# Patient Record
Sex: Female | Born: 1960
Health system: Southern US, Community
[De-identification: ages and names within clinical notes are randomized; demographics above are authoritative.]

## PROBLEM LIST (undated history)

## (undated) DIAGNOSIS — I10 Essential (primary) hypertension: Secondary | ICD-10-CM

## (undated) DIAGNOSIS — M1990 Unspecified osteoarthritis, unspecified site: Secondary | ICD-10-CM

## (undated) DIAGNOSIS — D049 Carcinoma in situ of skin, unspecified: Secondary | ICD-10-CM

## (undated) DIAGNOSIS — E785 Hyperlipidemia, unspecified: Secondary | ICD-10-CM

## (undated) HISTORY — DX: Hyperlipidemia, unspecified: E78.5

## (undated) HISTORY — DX: Unspecified osteoarthritis, unspecified site: M19.90

## (undated) HISTORY — DX: Carcinoma in situ of skin, unspecified: D04.9

## (undated) HISTORY — PX: CATARACT EXTRACTION: SUR2

## (undated) HISTORY — DX: Essential (primary) hypertension: I10

## (undated) HISTORY — PX: HAND SURGERY: SHX662

---

## 1998-11-25 ENCOUNTER — Other Ambulatory Visit: Admission: RE | Admit: 1998-11-25 | Discharge: 1998-11-25 | Payer: Self-pay | Admitting: Obstetrics and Gynecology

## 1999-01-06 ENCOUNTER — Other Ambulatory Visit: Admission: RE | Admit: 1999-01-06 | Discharge: 1999-01-06 | Payer: Self-pay | Admitting: Obstetrics and Gynecology

## 1999-05-19 ENCOUNTER — Other Ambulatory Visit: Admission: RE | Admit: 1999-05-19 | Discharge: 1999-05-19 | Payer: Self-pay | Admitting: Obstetrics and Gynecology

## 1999-09-02 ENCOUNTER — Other Ambulatory Visit: Admission: RE | Admit: 1999-09-02 | Discharge: 1999-09-02 | Payer: Self-pay | Admitting: Obstetrics and Gynecology

## 1999-11-25 ENCOUNTER — Other Ambulatory Visit: Admission: RE | Admit: 1999-11-25 | Discharge: 1999-11-25 | Payer: Self-pay | Admitting: Obstetrics and Gynecology

## 2000-06-01 ENCOUNTER — Other Ambulatory Visit: Admission: RE | Admit: 2000-06-01 | Discharge: 2000-06-01 | Payer: Self-pay | Admitting: Obstetrics and Gynecology

## 2000-11-30 ENCOUNTER — Other Ambulatory Visit: Admission: RE | Admit: 2000-11-30 | Discharge: 2000-11-30 | Payer: Self-pay | Admitting: Obstetrics and Gynecology

## 2001-06-20 ENCOUNTER — Other Ambulatory Visit: Admission: RE | Admit: 2001-06-20 | Discharge: 2001-06-20 | Payer: Self-pay | Admitting: Obstetrics and Gynecology

## 2001-12-20 ENCOUNTER — Other Ambulatory Visit: Admission: RE | Admit: 2001-12-20 | Discharge: 2001-12-20 | Payer: Self-pay | Admitting: Obstetrics and Gynecology

## 2002-12-25 ENCOUNTER — Encounter: Payer: Self-pay | Admitting: Family Medicine

## 2002-12-25 ENCOUNTER — Encounter: Admission: RE | Admit: 2002-12-25 | Discharge: 2002-12-25 | Payer: Self-pay | Admitting: Family Medicine

## 2004-02-23 ENCOUNTER — Other Ambulatory Visit: Admission: RE | Admit: 2004-02-23 | Discharge: 2004-02-23 | Payer: Self-pay | Admitting: Family Medicine

## 2004-09-28 ENCOUNTER — Encounter: Payer: Self-pay | Admitting: Family Medicine

## 2005-02-08 ENCOUNTER — Encounter: Admission: RE | Admit: 2005-02-08 | Discharge: 2005-02-08 | Payer: Self-pay | Admitting: Family Medicine

## 2005-03-01 ENCOUNTER — Other Ambulatory Visit: Admission: RE | Admit: 2005-03-01 | Discharge: 2005-03-01 | Payer: Self-pay | Admitting: Family Medicine

## 2006-02-15 ENCOUNTER — Encounter: Admission: RE | Admit: 2006-02-15 | Discharge: 2006-02-15 | Payer: Self-pay | Admitting: Family Medicine

## 2006-03-23 ENCOUNTER — Other Ambulatory Visit: Admission: RE | Admit: 2006-03-23 | Discharge: 2006-03-23 | Payer: Self-pay | Admitting: Family Medicine

## 2007-02-21 ENCOUNTER — Encounter: Admission: RE | Admit: 2007-02-21 | Discharge: 2007-02-21 | Payer: Self-pay | Admitting: Family Medicine

## 2007-03-26 ENCOUNTER — Other Ambulatory Visit: Admission: RE | Admit: 2007-03-26 | Discharge: 2007-03-26 | Payer: Self-pay | Admitting: Family Medicine

## 2007-05-23 ENCOUNTER — Ambulatory Visit (HOSPITAL_COMMUNITY): Admission: RE | Admit: 2007-05-23 | Discharge: 2007-05-23 | Payer: Self-pay | Admitting: Family Medicine

## 2008-02-27 ENCOUNTER — Encounter: Admission: RE | Admit: 2008-02-27 | Discharge: 2008-02-27 | Payer: Self-pay | Admitting: Family Medicine

## 2008-04-01 ENCOUNTER — Other Ambulatory Visit: Admission: RE | Admit: 2008-04-01 | Discharge: 2008-04-01 | Payer: Self-pay | Admitting: Family Medicine

## 2008-05-06 ENCOUNTER — Ambulatory Visit (HOSPITAL_COMMUNITY): Admission: RE | Admit: 2008-05-06 | Discharge: 2008-05-06 | Payer: Self-pay | Admitting: Obstetrics and Gynecology

## 2008-07-14 ENCOUNTER — Encounter: Payer: Self-pay | Admitting: Obstetrics and Gynecology

## 2008-07-14 ENCOUNTER — Ambulatory Visit (HOSPITAL_COMMUNITY): Admission: RE | Admit: 2008-07-14 | Discharge: 2008-07-15 | Payer: Self-pay | Admitting: Obstetrics and Gynecology

## 2008-07-14 HISTORY — PX: ROBOTIC ASSISTED TOTAL HYSTERECTOMY: SHX6085

## 2009-03-04 ENCOUNTER — Encounter: Admission: RE | Admit: 2009-03-04 | Discharge: 2009-03-04 | Payer: Self-pay | Admitting: Obstetrics and Gynecology

## 2009-03-09 ENCOUNTER — Encounter: Admission: RE | Admit: 2009-03-09 | Discharge: 2009-03-09 | Payer: Self-pay | Admitting: Obstetrics and Gynecology

## 2009-04-02 ENCOUNTER — Other Ambulatory Visit: Admission: RE | Admit: 2009-04-02 | Discharge: 2009-04-02 | Payer: Self-pay | Admitting: Obstetrics and Gynecology

## 2009-12-01 ENCOUNTER — Other Ambulatory Visit: Admission: RE | Admit: 2009-12-01 | Discharge: 2009-12-01 | Payer: Self-pay | Admitting: Otolaryngology

## 2010-03-10 ENCOUNTER — Encounter: Admission: RE | Admit: 2010-03-10 | Discharge: 2010-03-10 | Payer: Self-pay | Admitting: Obstetrics and Gynecology

## 2010-07-28 ENCOUNTER — Emergency Department (HOSPITAL_COMMUNITY): Admission: EM | Admit: 2010-07-28 | Discharge: 2010-07-28 | Payer: Self-pay | Admitting: Emergency Medicine

## 2010-10-17 ENCOUNTER — Encounter: Payer: Self-pay | Admitting: Obstetrics and Gynecology

## 2011-01-18 ENCOUNTER — Other Ambulatory Visit: Payer: Self-pay | Admitting: Specialist

## 2011-01-18 DIAGNOSIS — M25562 Pain in left knee: Secondary | ICD-10-CM

## 2011-01-20 ENCOUNTER — Other Ambulatory Visit: Payer: Self-pay

## 2011-02-06 ENCOUNTER — Other Ambulatory Visit: Payer: Self-pay | Admitting: Obstetrics and Gynecology

## 2011-02-06 DIAGNOSIS — Z1231 Encounter for screening mammogram for malignant neoplasm of breast: Secondary | ICD-10-CM

## 2011-02-07 NOTE — Op Note (Signed)
NAME:  Wendy Zimmerman, Wendy Zimmerman                    ACCOUNT NO.:  [id number]   MEDICAL RECORD NO.:  [id number]          PATIENT TYPE:  OIB   LOCATION:  1443                         FACILITY:  [facility]   PHYSICIAN:  Cynthia P. Romine, M.D.DATE OF BIRTH:  1961/08/28   DATE OF PROCEDURE:  07/14/2008  DATE OF DISCHARGE:                               OPERATIVE REPORT   PREOPERATIVE DIAGNOSES:  1. Menorrhagia.  2. Large uterine fibroid.   POSTOPERATIVE DIAGNOSES:  1. Menorrhagia.  2. Large uterine fibroid.   PATHOLOGY:  Pending.   PROCEDURE:  Robotic assisted total laparoscopic hysterectomy.   SURGEON:  Dr. Arline Asp Romine   ASSISTANT:  Dr. Genia Del, M.D.   ANESTHESIA:  General endotracheal.   BLOOD LOSS:  200 mL   COMPLICATIONS:  None.   PROCEDURE:  The patient was taken to the operating room and after  induction of adequate general endotracheal anesthesia was positioned in  the dorsal lithotomy position and prepped and draped in the usual  fashion.  A posterior weighted and anterior Sims retractor were placed.  The cervix was grasped on its anterior lip with a single-tooth  tenaculum.  The cervix sounded to 16 cm.  A 10 cm Reamy manipulator was  placed with a medium sized KOH ring and 50 mL of air was used in the  vaginal occluder. Attention was next turned to the abdomen.  A 3 cm  incision was made approximately 6 cm above the umbilicus in the midline  after infiltrating it with half percent plain Marcaine.  The incision  was carried down to the fascia with hemostats.  The fascia was grasped  with Kochers and entered with Mayo scissors.  The fascia was opened with  the scissors. The underlying peritoneum was penetrated bluntly with a  fingertip.  A pursestring suture of 0-Vicryl was placed around the  fascia.  The Hasson manipulator was placed into the peritoneal cavity in  its proper placement and documented with the robotic laparoscope.  The  abdomen was then insufflated with the  automatic insufflator with about  2.5 liters of CO2 on low flow.  The robotic trocars were then marked  staying approximately 9 to 10 cm from the umbilical port and then from  each other.  The two robotic ports on either side of the umbilicus, at  about the same level and the assistance port and the 3rd arm port  slightly lower towards the anterior iliac spine.  The robotic trocars  and the assistance port were inserted under direct visualization.  A PK  gyrus and monopolar scissors were inserted and the pelvis was inspected.  The uterus was quite large with a single fundal myoma.  The tubes and  ovaries, however, appeared normal.  The round ligaments on either side  were coagulated with the PK gyrus and cut with scissors.  The anterior  leaf of the broad ligament was taken down sharply.  The pedicle  containing the utero-ovarian ligaments and the tube on each side was  coagulated and then cut with the monopolar scissors.  The posterior  peritoneum  was taken down sharply.  The bladder was dissected off the  cervix.  The uterine arteries on each side were coagulated multiple  times and then cut.  The monopolar scissors were used to cut around the  opening that had been placed vaginally.  The specimen was passed from  the patient's vaginal cuff.  Because the morcellator is not currently  available due to a manufacturer recall, the specimen was morcellated  vaginally.  The surgeon left the console after removing the robotic  instruments and elevated the patient's legs in the higher dorsolithotomy  position; took her out of her steep Trendelenburg and morcellated the  uterus vaginally.  When it was removed, the surgeon was then able to  close the vaginal cuff using five figure-of-eight sutures of 0-Vicryl  and hemostasis was obtained.  Attention was next turned back to the  abdomen and the laparoscope was reinserted.  The pedicles appeared dry.  The pelvis was irrigated.  There was a small  area of bleeding along the  right pelvic side wall were Surgicel was placed and hemostasis was  achieved.  The uterine artery pedicles and the infundibulopelvic were  dry.  The instruments were then removed under direct visualization.  The  pursestring suture around the fascia above the umbilicus was tied with  good closure of the fascia.  The other incisions were closed  subcuticularly with 4-0 Vicryl and then Dermabond and the procedure was  terminated.  The patient tolerated it well and went in satisfactory  condition to post anesthesia recovery.  The sponge, instrument and  needle counts were correct x3.      Cynthia P. Romine, M.D.  Electronically Signed     CPR/MEDQ  D:  07/14/2008  T:  07/14/2008  Job:  [id number]   cc:   Artist Pais, M.D.  Fax: [phone]   Carola J. Gerri Spore, M.D.  Fax: [phone]

## 2011-03-16 ENCOUNTER — Ambulatory Visit
Admission: RE | Admit: 2011-03-16 | Discharge: 2011-03-16 | Disposition: A | Discharge status: Home / Self Care | Payer: BC Managed Care – PPO | Source: Ambulatory Visit | Attending: Obstetrics and Gynecology | Admitting: Obstetrics and Gynecology

## 2011-03-16 DIAGNOSIS — Z1231 Encounter for screening mammogram for malignant neoplasm of breast: Secondary | ICD-10-CM

## 2011-06-27 LAB — CBC
HCT: 24.8 — ABNORMAL LOW
HCT: 27.6 — ABNORMAL LOW
HCT: 37.4
Hemoglobin: 12.3
Hemoglobin: 8.7 — ABNORMAL LOW
Hemoglobin: 9.3 — ABNORMAL LOW
MCHC: 33
MCHC: 33.8
MCHC: 34.4
MCV: 87.5
MCV: 87.6
MCV: 88.1
Platelets: 207
Platelets: 240
Platelets: 296
RBC: 2.83 — ABNORMAL LOW
RBC: 3.15 — ABNORMAL LOW
RBC: 4.25
RDW: 12.4
RDW: 12.7
RDW: 13
WBC: 10.1
WBC: 5.3
WBC: 8.8

## 2011-06-27 LAB — BASIC METABOLIC PANEL
BUN: 7
CO2: 27
Calcium: 8.8
Chloride: 104
Creatinine, Ser: 0.81
GFR calc Af Amer: >60
GFR calc non Af Amer: >60
Glucose, Bld: 96
Potassium: 3.5
Sodium: 138

## 2011-06-27 LAB — TYPE AND SCREEN
ABO/RH(D): O POS
Antibody Screen: NEGATIVE

## 2011-06-27 LAB — HCG, SERUM, QUALITATIVE: Preg, Serum: NEGATIVE

## 2011-06-27 LAB — ABO/RH: ABO/RH(D): O POS

## 2012-02-05 ENCOUNTER — Other Ambulatory Visit: Payer: Self-pay | Admitting: Family Medicine

## 2012-02-05 DIAGNOSIS — Z1231 Encounter for screening mammogram for malignant neoplasm of breast: Secondary | ICD-10-CM

## 2012-03-29 ENCOUNTER — Ambulatory Visit
Admission: RE | Admit: 2012-03-29 | Discharge: 2012-03-29 | Disposition: A | Discharge status: Home / Self Care | Payer: BC Managed Care – PPO | Source: Ambulatory Visit | Attending: Family Medicine | Admitting: Family Medicine

## 2012-03-29 DIAGNOSIS — Z1231 Encounter for screening mammogram for malignant neoplasm of breast: Secondary | ICD-10-CM

## 2013-02-18 ENCOUNTER — Other Ambulatory Visit: Payer: Self-pay

## 2013-02-18 DIAGNOSIS — Z1231 Encounter for screening mammogram for malignant neoplasm of breast: Secondary | ICD-10-CM

## 2013-04-03 ENCOUNTER — Ambulatory Visit
Admission: RE | Admit: 2013-04-03 | Discharge: 2013-04-03 | Disposition: A | Discharge status: Home / Self Care | Payer: BC Managed Care – PPO | Source: Ambulatory Visit

## 2013-04-03 DIAGNOSIS — Z1231 Encounter for screening mammogram for malignant neoplasm of breast: Secondary | ICD-10-CM

## 2014-02-26 ENCOUNTER — Other Ambulatory Visit: Payer: Self-pay

## 2014-02-26 DIAGNOSIS — Z1231 Encounter for screening mammogram for malignant neoplasm of breast: Secondary | ICD-10-CM

## 2014-03-18 ENCOUNTER — Encounter: Payer: Self-pay | Admitting: *Deleted

## 2014-03-18 ENCOUNTER — Encounter: Payer: Self-pay | Admitting: Interventional Cardiology

## 2014-03-18 DIAGNOSIS — I10 Essential (primary) hypertension: Secondary | ICD-10-CM | POA: Insufficient documentation

## 2014-03-18 DIAGNOSIS — E785 Hyperlipidemia, unspecified: Secondary | ICD-10-CM | POA: Insufficient documentation

## 2014-03-18 DIAGNOSIS — D049 Carcinoma in situ of skin, unspecified: Secondary | ICD-10-CM | POA: Insufficient documentation

## 2014-03-18 DIAGNOSIS — M1990 Unspecified osteoarthritis, unspecified site: Secondary | ICD-10-CM | POA: Insufficient documentation

## 2014-04-09 ENCOUNTER — Ambulatory Visit: Payer: BC Managed Care – PPO

## 2014-04-10 ENCOUNTER — Ambulatory Visit
Admission: RE | Admit: 2014-04-10 | Discharge: 2014-04-10 | Disposition: A | Discharge status: Home / Self Care | Payer: BC Managed Care – PPO | Source: Ambulatory Visit

## 2014-04-10 DIAGNOSIS — Z1231 Encounter for screening mammogram for malignant neoplasm of breast: Secondary | ICD-10-CM

## 2015-03-05 ENCOUNTER — Other Ambulatory Visit: Payer: Self-pay

## 2015-03-05 DIAGNOSIS — Z1231 Encounter for screening mammogram for malignant neoplasm of breast: Secondary | ICD-10-CM

## 2015-04-15 ENCOUNTER — Ambulatory Visit
Admission: RE | Admit: 2015-04-15 | Discharge: 2015-04-15 | Disposition: A | Discharge status: Home / Self Care | Payer: BLUE CROSS/BLUE SHIELD | Source: Ambulatory Visit

## 2015-04-15 DIAGNOSIS — Z1231 Encounter for screening mammogram for malignant neoplasm of breast: Secondary | ICD-10-CM

## 2015-04-20 ENCOUNTER — Other Ambulatory Visit: Payer: Self-pay | Admitting: Family Medicine

## 2015-04-20 DIAGNOSIS — R928 Other abnormal and inconclusive findings on diagnostic imaging of breast: Secondary | ICD-10-CM

## 2015-04-23 ENCOUNTER — Ambulatory Visit
Admission: RE | Admit: 2015-04-23 | Discharge: 2015-04-23 | Disposition: A | Discharge status: Home / Self Care | Payer: BLUE CROSS/BLUE SHIELD | Source: Ambulatory Visit | Attending: Family Medicine | Admitting: Family Medicine

## 2015-04-23 DIAGNOSIS — R928 Other abnormal and inconclusive findings on diagnostic imaging of breast: Secondary | ICD-10-CM

## 2016-03-17 ENCOUNTER — Other Ambulatory Visit: Payer: Self-pay | Admitting: Family Medicine

## 2016-03-17 DIAGNOSIS — Z1231 Encounter for screening mammogram for malignant neoplasm of breast: Secondary | ICD-10-CM

## 2016-04-13 ENCOUNTER — Ambulatory Visit
Admission: RE | Admit: 2016-04-13 | Discharge: 2016-04-13 | Disposition: A | Discharge status: Home / Self Care | Payer: PRIVATE HEALTH INSURANCE | Source: Ambulatory Visit | Attending: Family Medicine | Admitting: Family Medicine

## 2016-04-13 DIAGNOSIS — Z1231 Encounter for screening mammogram for malignant neoplasm of breast: Secondary | ICD-10-CM

## 2017-03-02 ENCOUNTER — Other Ambulatory Visit: Payer: Self-pay | Admitting: Family Medicine

## 2017-03-02 DIAGNOSIS — Z1231 Encounter for screening mammogram for malignant neoplasm of breast: Secondary | ICD-10-CM

## 2017-04-20 ENCOUNTER — Ambulatory Visit
Admission: RE | Admit: 2017-04-20 | Discharge: 2017-04-20 | Disposition: A | Discharge status: Home / Self Care | Payer: PRIVATE HEALTH INSURANCE | Source: Ambulatory Visit | Attending: Family Medicine | Admitting: Family Medicine

## 2017-04-20 DIAGNOSIS — Z1231 Encounter for screening mammogram for malignant neoplasm of breast: Secondary | ICD-10-CM

## 2017-07-13 DIAGNOSIS — H26493 Other secondary cataract, bilateral: Secondary | ICD-10-CM | POA: Diagnosis not present

## 2017-07-20 DIAGNOSIS — Z0000 Encounter for general adult medical examination without abnormal findings: Secondary | ICD-10-CM | POA: Diagnosis not present

## 2017-07-20 DIAGNOSIS — E7800 Pure hypercholesterolemia, unspecified: Secondary | ICD-10-CM | POA: Diagnosis not present

## 2017-07-20 DIAGNOSIS — Z23 Encounter for immunization: Secondary | ICD-10-CM | POA: Diagnosis not present

## 2017-08-03 DIAGNOSIS — H26492 Other secondary cataract, left eye: Secondary | ICD-10-CM | POA: Diagnosis not present

## 2017-08-03 DIAGNOSIS — Z961 Presence of intraocular lens: Secondary | ICD-10-CM | POA: Diagnosis not present

## 2017-08-03 DIAGNOSIS — H26491 Other secondary cataract, right eye: Secondary | ICD-10-CM | POA: Diagnosis not present

## 2017-08-10 DIAGNOSIS — Z961 Presence of intraocular lens: Secondary | ICD-10-CM | POA: Diagnosis not present

## 2017-08-21 DIAGNOSIS — H26492 Other secondary cataract, left eye: Secondary | ICD-10-CM | POA: Diagnosis not present

## 2017-08-31 DIAGNOSIS — Z961 Presence of intraocular lens: Secondary | ICD-10-CM | POA: Diagnosis not present

## 2017-10-19 DIAGNOSIS — R109 Unspecified abdominal pain: Secondary | ICD-10-CM | POA: Diagnosis not present

## 2017-10-22 ENCOUNTER — Other Ambulatory Visit: Payer: Self-pay | Admitting: Physician Assistant

## 2017-10-22 ENCOUNTER — Ambulatory Visit
Admission: RE | Admit: 2017-10-22 | Discharge: 2017-10-22 | Disposition: A | Discharge status: Home / Self Care | Payer: 59 | Source: Ambulatory Visit | Attending: Physician Assistant | Admitting: Physician Assistant

## 2017-10-22 ENCOUNTER — Ambulatory Visit (HOSPITAL_COMMUNITY): Admission: RE | Admit: 2017-10-22 | Payer: 59 | Source: Ambulatory Visit

## 2017-10-22 DIAGNOSIS — R1084 Generalized abdominal pain: Secondary | ICD-10-CM

## 2017-10-22 DIAGNOSIS — R9389 Abnormal findings on diagnostic imaging of other specified body structures: Secondary | ICD-10-CM

## 2017-10-22 DIAGNOSIS — J984 Other disorders of lung: Secondary | ICD-10-CM | POA: Diagnosis not present

## 2017-10-22 DIAGNOSIS — M546 Pain in thoracic spine: Principal | ICD-10-CM

## 2017-10-22 DIAGNOSIS — R109 Unspecified abdominal pain: Secondary | ICD-10-CM | POA: Diagnosis not present

## 2017-10-22 DIAGNOSIS — G8929 Other chronic pain: Secondary | ICD-10-CM

## 2017-10-22 DIAGNOSIS — R918 Other nonspecific abnormal finding of lung field: Secondary | ICD-10-CM | POA: Diagnosis not present

## 2017-10-22 DIAGNOSIS — K449 Diaphragmatic hernia without obstruction or gangrene: Secondary | ICD-10-CM | POA: Diagnosis not present

## 2017-10-22 DIAGNOSIS — R079 Chest pain, unspecified: Secondary | ICD-10-CM | POA: Diagnosis not present

## 2017-10-22 MED ORDER — IOPAMIDOL (ISOVUE-300) INJECTION 61%
125.0000 mL | Freq: Once | INTRAVENOUS | Status: AC | PRN
  Administered 2017-10-22: 125 mL via INTRAVENOUS

## 2017-11-02 DIAGNOSIS — H04123 Dry eye syndrome of bilateral lacrimal glands: Secondary | ICD-10-CM | POA: Diagnosis not present

## 2017-11-05 DIAGNOSIS — N390 Urinary tract infection, site not specified: Secondary | ICD-10-CM | POA: Diagnosis not present

## 2017-11-09 DIAGNOSIS — Z713 Dietary counseling and surveillance: Secondary | ICD-10-CM | POA: Diagnosis not present

## 2018-01-17 DIAGNOSIS — C44619 Basal cell carcinoma of skin of left upper limb, including shoulder: Secondary | ICD-10-CM | POA: Diagnosis not present

## 2018-01-17 DIAGNOSIS — C44519 Basal cell carcinoma of skin of other part of trunk: Secondary | ICD-10-CM | POA: Diagnosis not present

## 2018-01-17 DIAGNOSIS — L820 Inflamed seborrheic keratosis: Secondary | ICD-10-CM | POA: Diagnosis not present

## 2018-02-01 DIAGNOSIS — E7800 Pure hypercholesterolemia, unspecified: Secondary | ICD-10-CM | POA: Diagnosis not present

## 2018-02-22 ENCOUNTER — Other Ambulatory Visit: Payer: Self-pay | Admitting: Family Medicine

## 2018-02-22 DIAGNOSIS — Z1231 Encounter for screening mammogram for malignant neoplasm of breast: Secondary | ICD-10-CM

## 2018-03-01 DIAGNOSIS — Z85828 Personal history of other malignant neoplasm of skin: Secondary | ICD-10-CM | POA: Diagnosis not present

## 2018-03-01 DIAGNOSIS — Z08 Encounter for follow-up examination after completed treatment for malignant neoplasm: Secondary | ICD-10-CM | POA: Diagnosis not present

## 2018-04-12 DIAGNOSIS — C44519 Basal cell carcinoma of skin of other part of trunk: Secondary | ICD-10-CM | POA: Diagnosis not present

## 2018-04-26 ENCOUNTER — Ambulatory Visit
Admission: RE | Admit: 2018-04-26 | Discharge: 2018-04-26 | Disposition: A | Discharge status: Home / Self Care | Payer: 59 | Source: Ambulatory Visit | Attending: Family Medicine | Admitting: Family Medicine

## 2018-04-26 DIAGNOSIS — Z1231 Encounter for screening mammogram for malignant neoplasm of breast: Secondary | ICD-10-CM

## 2018-07-06 DIAGNOSIS — Z23 Encounter for immunization: Secondary | ICD-10-CM | POA: Diagnosis not present

## 2018-09-16 DIAGNOSIS — E7800 Pure hypercholesterolemia, unspecified: Secondary | ICD-10-CM | POA: Diagnosis not present

## 2018-09-16 DIAGNOSIS — I10 Essential (primary) hypertension: Secondary | ICD-10-CM | POA: Diagnosis not present

## 2018-09-16 DIAGNOSIS — Z1159 Encounter for screening for other viral diseases: Secondary | ICD-10-CM | POA: Diagnosis not present

## 2018-09-16 DIAGNOSIS — Z0000 Encounter for general adult medical examination without abnormal findings: Secondary | ICD-10-CM | POA: Diagnosis not present

## 2019-02-24 ENCOUNTER — Other Ambulatory Visit: Payer: Self-pay | Admitting: Physician Assistant

## 2019-02-24 ENCOUNTER — Ambulatory Visit
Admission: RE | Admit: 2019-02-24 | Discharge: 2019-02-24 | Disposition: A | Discharge status: Home / Self Care | Payer: 59 | Source: Ambulatory Visit | Attending: Physician Assistant | Admitting: Physician Assistant

## 2019-02-24 DIAGNOSIS — R110 Nausea: Secondary | ICD-10-CM

## 2019-02-27 ENCOUNTER — Emergency Department (HOSPITAL_COMMUNITY): Payer: 59

## 2019-02-27 ENCOUNTER — Inpatient Hospital Stay (HOSPITAL_COMMUNITY)
Admission: EM | Admit: 2019-02-27 | Discharge: 2019-03-03 | DRG: 419 | Disposition: A | Discharge status: Home / Self Care | Payer: 59 | Attending: Internal Medicine | Admitting: Internal Medicine

## 2019-02-27 ENCOUNTER — Encounter (HOSPITAL_COMMUNITY): Payer: Self-pay

## 2019-02-27 ENCOUNTER — Other Ambulatory Visit: Payer: Self-pay

## 2019-02-27 DIAGNOSIS — Z6837 Body mass index (BMI) 37.0-37.9, adult: Secondary | ICD-10-CM | POA: Diagnosis not present

## 2019-02-27 DIAGNOSIS — K8046 Calculus of bile duct with acute and chronic cholecystitis without obstruction: Principal | ICD-10-CM | POA: Diagnosis present

## 2019-02-27 DIAGNOSIS — E876 Hypokalemia: Secondary | ICD-10-CM | POA: Diagnosis present

## 2019-02-27 DIAGNOSIS — E785 Hyperlipidemia, unspecified: Secondary | ICD-10-CM | POA: Diagnosis present

## 2019-02-27 DIAGNOSIS — R739 Hyperglycemia, unspecified: Secondary | ICD-10-CM | POA: Diagnosis present

## 2019-02-27 DIAGNOSIS — K5750 Diverticulosis of both small and large intestine without perforation or abscess without bleeding: Secondary | ICD-10-CM | POA: Diagnosis present

## 2019-02-27 DIAGNOSIS — K219 Gastro-esophageal reflux disease without esophagitis: Secondary | ICD-10-CM | POA: Diagnosis present

## 2019-02-27 DIAGNOSIS — E7800 Pure hypercholesterolemia, unspecified: Secondary | ICD-10-CM | POA: Diagnosis not present

## 2019-02-27 DIAGNOSIS — K838 Other specified diseases of biliary tract: Secondary | ICD-10-CM | POA: Diagnosis present

## 2019-02-27 DIAGNOSIS — R21 Rash and other nonspecific skin eruption: Secondary | ICD-10-CM | POA: Diagnosis not present

## 2019-02-27 DIAGNOSIS — K7581 Nonalcoholic steatohepatitis (NASH): Secondary | ICD-10-CM | POA: Diagnosis present

## 2019-02-27 DIAGNOSIS — Z888 Allergy status to other drugs, medicaments and biological substances status: Secondary | ICD-10-CM | POA: Diagnosis not present

## 2019-02-27 DIAGNOSIS — Z20828 Contact with and (suspected) exposure to other viral communicable diseases: Secondary | ICD-10-CM | POA: Diagnosis present

## 2019-02-27 DIAGNOSIS — K8050 Calculus of bile duct without cholangitis or cholecystitis without obstruction: Secondary | ICD-10-CM

## 2019-02-27 DIAGNOSIS — F40240 Claustrophobia: Secondary | ICD-10-CM | POA: Diagnosis present

## 2019-02-27 DIAGNOSIS — K429 Umbilical hernia without obstruction or gangrene: Secondary | ICD-10-CM | POA: Diagnosis present

## 2019-02-27 DIAGNOSIS — K8080 Other cholelithiasis without obstruction: Secondary | ICD-10-CM | POA: Diagnosis not present

## 2019-02-27 DIAGNOSIS — Z79899 Other long term (current) drug therapy: Secondary | ICD-10-CM

## 2019-02-27 DIAGNOSIS — X58XXXA Exposure to other specified factors, initial encounter: Secondary | ICD-10-CM | POA: Diagnosis not present

## 2019-02-27 DIAGNOSIS — R7401 Elevation of levels of liver transaminase levels: Secondary | ICD-10-CM | POA: Diagnosis present

## 2019-02-27 DIAGNOSIS — I10 Essential (primary) hypertension: Secondary | ICD-10-CM | POA: Diagnosis present

## 2019-02-27 DIAGNOSIS — K810 Acute cholecystitis: Secondary | ICD-10-CM

## 2019-02-27 DIAGNOSIS — K660 Peritoneal adhesions (postprocedural) (postinfection): Secondary | ICD-10-CM | POA: Diagnosis present

## 2019-02-27 DIAGNOSIS — K828 Other specified diseases of gallbladder: Secondary | ICD-10-CM | POA: Diagnosis present

## 2019-02-27 DIAGNOSIS — T7840XA Allergy, unspecified, initial encounter: Secondary | ICD-10-CM | POA: Diagnosis not present

## 2019-02-27 DIAGNOSIS — R112 Nausea with vomiting, unspecified: Secondary | ICD-10-CM | POA: Diagnosis present

## 2019-02-27 DIAGNOSIS — R740 Nonspecific elevation of levels of transaminase and lactic acid dehydrogenase [LDH]: Secondary | ICD-10-CM | POA: Diagnosis not present

## 2019-02-27 DIAGNOSIS — R1013 Epigastric pain: Secondary | ICD-10-CM | POA: Diagnosis not present

## 2019-02-27 DIAGNOSIS — K819 Cholecystitis, unspecified: Secondary | ICD-10-CM | POA: Diagnosis not present

## 2019-02-27 DIAGNOSIS — Z419 Encounter for procedure for purposes other than remedying health state, unspecified: Secondary | ICD-10-CM

## 2019-02-27 DIAGNOSIS — R1010 Upper abdominal pain, unspecified: Secondary | ICD-10-CM | POA: Diagnosis not present

## 2019-02-27 DIAGNOSIS — R109 Unspecified abdominal pain: Secondary | ICD-10-CM | POA: Insufficient documentation

## 2019-02-27 LAB — COMPREHENSIVE METABOLIC PANEL
ALT: 99 U/L — ABNORMAL HIGH (ref 0–44)
ALT: 174 U/L — ABNORMAL HIGH (ref 0–44)
AST: 131 U/L — ABNORMAL HIGH (ref 15–41)
AST: 236 U/L — ABNORMAL HIGH (ref 15–41)
Albumin: 4.3 g/dL (ref 3.5–5.0)
Albumin: 3.5 g/dL (ref 3.5–5.0)
Alkaline Phosphatase: 263 U/L — ABNORMAL HIGH (ref 38–126)
Alkaline Phosphatase: 250 U/L — ABNORMAL HIGH (ref 38–126)
Anion gap: 12 (ref 5–15)
Anion gap: 7 (ref 5–15)
BUN: 16 mg/dL (ref 6–20)
BUN: 17 mg/dL (ref 6–20)
CO2: 26 mmol/L (ref 22–32)
CO2: 26 mmol/L (ref 22–32)
Calcium: 9.1 mg/dL (ref 8.9–10.3)
Calcium: 8.3 mg/dL — ABNORMAL LOW (ref 8.9–10.3)
Chloride: 101 mmol/L (ref 98–111)
Chloride: 106 mmol/L (ref 98–111)
Creatinine, Ser: 1.11 mg/dL — ABNORMAL HIGH (ref 0.44–1.00)
Creatinine, Ser: 0.97 mg/dL (ref 0.44–1.00)
GFR calc Af Amer: >60 mL/min (ref >60)
GFR calc Af Amer: >60 mL/min (ref >60)
GFR calc non Af Amer: 54 mL/min — ABNORMAL LOW (ref >60)
GFR calc non Af Amer: >60 mL/min (ref >60)
Glucose, Bld: 152 mg/dL — ABNORMAL HIGH (ref 70–99)
Glucose, Bld: 142 mg/dL — ABNORMAL HIGH (ref 70–99)
Potassium: 2.9 mmol/L — ABNORMAL LOW (ref 3.5–5.1)
Potassium: 3.3 mmol/L — ABNORMAL LOW (ref 3.5–5.1)
Sodium: 139 mmol/L (ref 135–145)
Sodium: 139 mmol/L (ref 135–145)
Total Bilirubin: 2.1 mg/dL — ABNORMAL HIGH (ref 0.3–1.2)
Total Bilirubin: 3.9 mg/dL — ABNORMAL HIGH (ref 0.3–1.2)
Total Protein: 8 g/dL (ref 6.5–8.1)
Total Protein: 6.9 g/dL (ref 6.5–8.1)

## 2019-02-27 LAB — CBC WITH DIFFERENTIAL/PLATELET
Abs Immature Granulocytes: 0.04 10*3/uL (ref 0.00–0.07)
Basophils Absolute: 0 10*3/uL (ref 0.0–0.1)
Basophils Relative: 0 %
Eosinophils Absolute: 0 10*3/uL (ref 0.0–0.5)
Eosinophils Relative: 0 %
HCT: 37.1 % (ref 36.0–46.0)
Hemoglobin: 12 g/dL (ref 12.0–15.0)
Immature Granulocytes: 0 %
Lymphocytes Relative: 6 %
Lymphs Abs: 0.7 10*3/uL (ref 0.7–4.0)
MCH: 28.3 pg (ref 26.0–34.0)
MCHC: 32.3 g/dL (ref 30.0–36.0)
MCV: 87.5 fL (ref 80.0–100.0)
Monocytes Absolute: 0.7 10*3/uL (ref 0.1–1.0)
Monocytes Relative: 5 %
Neutro Abs: 11.5 10*3/uL — ABNORMAL HIGH (ref 1.7–7.7)
Neutrophils Relative %: 89 %
Platelets: 250 10*3/uL (ref 150–400)
RBC: 4.24 MIL/uL (ref 3.87–5.11)
RDW: 14.1 % (ref 11.5–15.5)
WBC: 13 10*3/uL — ABNORMAL HIGH (ref 4.0–10.5)
nRBC: 0 % (ref 0.0–0.2)

## 2019-02-27 LAB — URINALYSIS, ROUTINE W REFLEX MICROSCOPIC
Bacteria, UA: NONE SEEN
Glucose, UA: NEGATIVE mg/dL
Hgb urine dipstick: NEGATIVE
Ketones, ur: NEGATIVE mg/dL
Leukocytes,Ua: NEGATIVE
Nitrite: NEGATIVE
Protein, ur: 30 mg/dL — AB
Specific Gravity, Urine: >1.046 — ABNORMAL HIGH (ref 1.005–1.030)
pH: 5 (ref 5.0–8.0)

## 2019-02-27 LAB — CBC
HCT: 42.4 % (ref 36.0–46.0)
Hemoglobin: 13.4 g/dL (ref 12.0–15.0)
MCH: 27.3 pg (ref 26.0–34.0)
MCHC: 31.6 g/dL (ref 30.0–36.0)
MCV: 86.5 fL (ref 80.0–100.0)
Platelets: 296 10*3/uL (ref 150–400)
RBC: 4.9 MIL/uL (ref 3.87–5.11)
RDW: 13.8 % (ref 11.5–15.5)
WBC: 10.4 10*3/uL (ref 4.0–10.5)
nRBC: 0 % (ref 0.0–0.2)

## 2019-02-27 LAB — LIPASE, BLOOD: Lipase: 35 U/L (ref 11–51)

## 2019-02-27 LAB — SARS CORONAVIRUS 2 BY RT PCR (HOSPITAL ORDER, PERFORMED IN ~~LOC~~ HOSPITAL LAB): SARS Coronavirus 2: NEGATIVE

## 2019-02-27 MED ORDER — ACETAMINOPHEN 650 MG RE SUPP: 650.0000 mg | Freq: Four times a day (QID) | RECTAL | Status: DC | PRN

## 2019-02-27 MED ORDER — POTASSIUM CHLORIDE 10 MEQ/100ML IV SOLN
10.0000 meq | INTRAVENOUS | Status: AC
  Administered 2019-02-27 (×3): 10 meq via INTRAVENOUS
  Filled 2019-02-27 (×3): qty 100

## 2019-02-27 MED ORDER — CYCLOSPORINE 0.05 % OP EMUL
1.0000 [drp] | Freq: Two times a day (BID) | OPHTHALMIC | Status: DC
  Administered 2019-02-27: 1 [drp] via OPHTHALMIC
  Filled 2019-02-27 (×2): qty 30

## 2019-02-27 MED ORDER — ACETAMINOPHEN 325 MG PO TABS
650.0000 mg | ORAL_TABLET | Freq: Four times a day (QID) | ORAL | Status: DC | PRN
  Administered 2019-02-28: 650 mg via ORAL
  Filled 2019-02-27: qty 2

## 2019-02-27 MED ORDER — SODIUM CHLORIDE 0.9% FLUSH: 3.0000 mL | Freq: Once | INTRAVENOUS | Status: DC

## 2019-02-27 MED ORDER — SODIUM CHLORIDE 0.9 % IV BOLUS
1000.0000 mL | Freq: Once | INTRAVENOUS | Status: AC
  Administered 2019-02-27: 1000 mL via INTRAVENOUS

## 2019-02-27 MED ORDER — BUPROPION HCL ER (XL) 150 MG PO TB24
150.0000 mg | ORAL_TABLET | ORAL | Status: DC
  Administered 2019-02-28 – 2019-03-02 (×2): 150 mg via ORAL
  Filled 2019-02-27 (×2): qty 1

## 2019-02-27 MED ORDER — HYDROMORPHONE HCL 1 MG/ML IJ SOLN
1.0000 mg | Freq: Once | INTRAMUSCULAR | Status: AC
  Administered 2019-02-27: 1 mg via INTRAVENOUS
  Filled 2019-02-27: qty 1

## 2019-02-27 MED ORDER — ONDANSETRON HCL 4 MG/2ML IJ SOLN: 4.0000 mg | Freq: Four times a day (QID) | INTRAMUSCULAR | Status: DC | PRN

## 2019-02-27 MED ORDER — KCL IN DEXTROSE-NACL 20-5-0.2 MEQ/L-%-% IV SOLN
INTRAVENOUS | Status: DC
  Administered 2019-02-27 – 2019-03-01 (×4): via INTRAVENOUS
  Filled 2019-02-27 (×5): qty 1000

## 2019-02-27 MED ORDER — IOHEXOL 300 MG/ML  SOLN
100.0000 mL | Freq: Once | INTRAMUSCULAR | Status: AC | PRN
  Administered 2019-02-27: 100 mL via INTRAVENOUS

## 2019-02-27 MED ORDER — ONDANSETRON HCL 4 MG/2ML IJ SOLN
4.0000 mg | Freq: Once | INTRAMUSCULAR | Status: AC
  Administered 2019-02-27: 4 mg via INTRAVENOUS
  Filled 2019-02-27: qty 2

## 2019-02-27 MED ORDER — SODIUM CHLORIDE (PF) 0.9 % IJ SOLN
INTRAMUSCULAR | Status: AC
  Filled 2019-02-27: qty 50

## 2019-02-27 MED ORDER — HYDROMORPHONE HCL 1 MG/ML IJ SOLN
1.0000 mg | INTRAMUSCULAR | Status: DC | PRN
  Administered 2019-02-28 (×3): 1 mg via INTRAVENOUS
  Filled 2019-02-27 (×3): qty 1

## 2019-02-27 MED ORDER — ONDANSETRON HCL 4 MG PO TABS: 4.0000 mg | ORAL_TABLET | Freq: Four times a day (QID) | ORAL | Status: DC | PRN

## 2019-02-27 MED ORDER — ONDANSETRON HCL 4 MG/2ML IJ SOLN
4.0000 mg | Freq: Once | INTRAMUSCULAR | Status: AC | PRN
  Administered 2019-02-27: 4 mg via INTRAVENOUS
  Filled 2019-02-27: qty 2

## 2019-02-27 MED ORDER — ONDANSETRON HCL 4 MG/2ML IJ SOLN
4.0000 mg | Freq: Four times a day (QID) | INTRAMUSCULAR | Status: DC | PRN
  Administered 2019-02-28: 4 mg via INTRAVENOUS
  Filled 2019-02-27: qty 2

## 2019-02-27 MED ORDER — PANTOPRAZOLE SODIUM 40 MG PO TBEC
40.0000 mg | DELAYED_RELEASE_TABLET | Freq: Every day | ORAL | Status: DC
  Administered 2019-02-28 – 2019-03-02 (×3): 40 mg via ORAL
  Filled 2019-02-27 (×3): qty 1

## 2019-02-27 NOTE — ED Notes (Signed)
MADE URINE REQUEST PATIENT UNABLE TO GO AT THIS TIME

## 2019-02-27 NOTE — Consult Note (Signed)
Reason for Consult: Probable CBD stones Referring Physician: Hospital team  Wendy Zimmerman is an 58 y.o. female.  HPI: Patient has had about 7 acute onset of midepigastric pain which can last anywhere from 30 minutes to a few hours associated with some nausea and she initially saw her primary doctor who did an ultrasound and in between the spells she is fine without any GI symptoms and is never been told she had elevated liver tests and her hospital computer chart in our office computer chart was reviewed and this morning she was woken up with the worst pain yet presented to the emergency room and CT scan showed her CBD being significantly more dilated than on recent ultrasound and her liver tests were increased and we were consulted for further work-up and plans she is currently refusing an MRCP since she was unable to get in the open MRI scanner a few years back and currently she is pain-free but does have a little nausea and is feeling better and she has not had an upper tract work-up in the past but has had a screening colonoscopy  Past Medical History:  Diagnosis Date  . Bowen disease   . HTN (hypertension)   . Hyperlipidemia   . Osteoarthritis     Past Surgical History:  Procedure Laterality Date  . CATARACT EXTRACTION    . HAND SURGERY      Family History  Problem Relation Age of Onset  . Cancer Other   . Hypertension Other   . CAD Other     Social History:  reports that she has never smoked. She has never used smokeless tobacco. She reports that she does not drink alcohol or use drugs.  Allergies:  Allergies  Allergen Reactions  . Lisinopril Swelling and Other (See Comments)    Face and tongue swelling    Medications: I have reviewed the patient's current medications.  Results for orders placed or performed during the hospital encounter of 02/27/19 (from the past 48 hour(s))  Lipase, blood     Status: None   Collection Time: 02/27/19  6:12 AM  Result Value Ref Range    Lipase 35 11 - 51 U/L    Comment: Performed at [facility], [place] [address], [place], [place] [address]  Comprehensive metabolic panel     Status: Abnormal   Collection Time: 02/27/19  6:12 AM  Result Value Ref Range   Sodium 139 135 - 145 mmol/L   Potassium 2.9 (L) 3.5 - 5.1 mmol/L   Chloride 101 98 - 111 mmol/L   CO2 26 22 - 32 mmol/L   Glucose, Bld 152 (H) 70 - 99 mg/dL   BUN 16 6 - 20 mg/dL   Creatinine, Ser 1.11 (H) 0.44 - 1.00 mg/dL   Calcium 9.1 8.9 - 10.3 mg/dL   Total Protein 8.0 6.5 - 8.1 g/dL   Albumin 4.3 3.5 - 5.0 g/dL   AST 131 (H) 15 - 41 U/L   ALT 99 (H) 0 - 44 U/L   Alkaline Phosphatase 263 (H) 38 - 126 U/L   Total Bilirubin 2.1 (H) 0.3 - 1.2 mg/dL   GFR calc non Af Amer 54 (L) >60 mL/min   GFR calc Af Amer >60 >60 mL/min   Anion gap 12 5 - 15    Comment: Performed at [facility], [place] [address], [place], [place] [address]  CBC     Status: None   Collection Time: 02/27/19  6:12 AM  Result Value Ref Range   WBC 10.4 4.0 - 10.5 K/uL   RBC 4.90 3.87 - 5.11 MIL/uL   Hemoglobin 13.4 12.0 - 15.0 g/dL   HCT 42.4 36.0 - 46.0 %   MCV 86.5 80.0 - 100.0 fL   MCH 27.3 26.0 - 34.0 pg   MCHC 31.6 30.0 - 36.0 g/dL   RDW 13.8 11.5 - 15.5 %   Platelets 296 150 - 400 K/uL   nRBC 0.0 0.0 - 0.2 %    Comment: Performed at [facility], [place] [address] [place], [place] [address]  SARS Coronavirus 2 (CEPHEID - Performed in [facility] hospital lab), Hosp Order     Status: None   Collection Time: 02/27/19 10:53 AM  Result Value Ref Range   SARS Coronavirus 2 NEGATIVE NEGATIVE    Comment: (NOTE) If result is NEGATIVE SARS-CoV-2 target nucleic acids are NOT DETECTED. The SARS-CoV-2 RNA is generally detectable in upper and lower  respiratory specimens during the acute phase of infection. The lowest  concentration of SARS-CoV-2 viral copies this assay can detect is 250  copies / mL. A negative result does not preclude  SARS-CoV-2 infection  and should not be used as the sole basis for treatment or other  patient management decisions.  A negative result may occur with  improper specimen collection / handling, submission of specimen other  than nasopharyngeal swab, presence of viral mutation(s) within the  areas targeted by this assay, and inadequate number of viral copies  (<250 copies / mL). A negative result must be combined with clinical  observations, patient history, and epidemiological information. If result is POSITIVE SARS-CoV-2 target nucleic acids are DETECTED. The SARS-CoV-2 RNA is generally detectable in upper and lower  respiratory specimens dur ing the acute phase of infection.  Positive  results are indicative of active infection with SARS-CoV-2.  Clinical  correlation with patient history and other diagnostic information is  necessary to determine patient infection status.  Positive results do  not rule out bacterial infection or co-infection with other viruses. If result is PRESUMPTIVE POSTIVE SARS-CoV-2 nucleic acids MAY BE PRESENT.   A presumptive positive result was obtained on the submitted specimen  and confirmed on repeat testing.  While 2019 novel coronavirus  (SARS-CoV-2) nucleic acids may be present in the submitted sample  additional confirmatory testing may be necessary for epidemiological  and / or clinical management purposes  to differentiate between  SARS-CoV-2 and other Sarbecovirus currently known to infect humans.  If clinically indicated additional testing with an alternate test  methodology [phone]) is advised. The SARS-CoV-2 RNA is generally  detectable in upper and lower respiratory sp ecimens during the acute  phase of infection. The expected result is Negative. Fact Sheet for Patients:  StrictlyIdeas.no Fact Sheet for Healthcare Providers: BankingDealers.co.za This test is not yet approved or cleared by the  [place] FDA and has been authorized for detection and/or diagnosis of SARS-CoV-2 by FDA under an Emergency Use Authorization (EUA).  This EUA will remain in effect (meaning this test can be used) for the duration of the COVID-19 declaration under Section 564(b)(1) of the Act, 21 U.S.C. section 360bbb-3(b)(1), unless the authorization is terminated or revoked sooner. Performed at [facility], [place] [address], [place], [place] [address]     Ct Abdomen Pelvis W Contrast  Result Date: 02/27/2019 CLINICAL DATA:  Severe abdominal pain since last night. Nausea and diarrhea EXAM: CT ABDOMEN AND PELVIS WITH CONTRAST TECHNIQUE: Multidetector CT  imaging of the abdomen and pelvis was performed using the standard protocol following bolus administration of intravenous contrast. CONTRAST:  155mL OMNIPAQUE IOHEXOL 300 MG/ML  SOLN COMPARISON:  10/22/2017 FINDINGS: Lower chest: Fluid seen in the lower esophagus, presumed reflux. Mild atelectasis or scarring at the lung bases. Hepatobiliary: No focal liver abnormality.Prominent diameter of intrahepatic bile ducts. Common bile duct is also newly enlarged at 13 mm diameter on coronal reformats. No calcified choledocholithiasis or acute cholecystitis. Pancreas: Unremarkable. Spleen: Unremarkable. Adrenals/Urinary Tract: Negative adrenals. No hydronephrosis or stone. Small left renal cystic density. Unremarkable bladder. Stomach/Bowel: No obstruction. No appendicitis. Sigmoid diverticulosis. Vascular/Lymphatic: No acute vascular abnormality. No mass or adenopathy. Reproductive:Hysterectomy. Other: No ascites or pneumoperitoneum.  Fatty right inguinal hernia. Musculoskeletal: No acute abnormalities. Remote fractures of the right hemipelvis with right sacroiliac ankylosis. IMPRESSION: 1. Mild intra and extrahepatic bile duct dilatation that is new from 2019, recommend sonographic correlation given LFTs. 2. Sigmoid diverticulosis. 3. Fatty right inguinal  hernia. Electronically Signed   By: Monte Fantasia M.D.   On: 02/27/2019 08:19    ROS negative except above Blood pressure 111/75, pulse 81, temperature 98.7 F (37.1 C), temperature source Oral, resp. rate 14, SpO2 96 %. Physical Exam vital signs stable afebrile no acute distress lungs are clear heart regular rate and rhythm abdomen is soft nontender occasional bowel sounds labs and CT reviewed as above  Assessment/Plan: Probable CBD stones Plan: We discussed MRCP versus endoscopic ultrasound versus ERCP and the pluses and minuses of all and after our conversation we elected to proceed with an ERCP tomorrow and the success rate and risks were discussed with further work-up and plans pending those findings  [place] E 02/27/2019, 3:09 PM

## 2019-02-27 NOTE — ED Notes (Signed)
Bed: WA10 Expected date:  Expected time:  Means of arrival:  Comments: 

## 2019-02-27 NOTE — ED Provider Notes (Addendum)
  Physical Exam  BP 114/75   Pulse 94   Temp 98.7 F (37.1 C) (Oral)   Resp 14   SpO2 91%   Physical Exam  ED Course/Procedures     Procedures  MDM  Patient presented with abdominal pain.  Has been recurrent over the last week.  Epigastric.  Had ultrasound 4 days ago that was reassuring, however now had CT scan done and has new elevated LFTs along with intra-and extrahepatic biliary dilatation.  No clear cause of the seen.  Patient's pain had improved but during oral trial of just water patient became nauseous diaphoretic and was unable to eat more.  With this inability to tolerate oral will admit to hospitalist along with consult [facility].  Of note patient does state that she does not do well in close spaces and cannot do a closed MRI.       Davonna Belling, MD 02/27/19 1037  Discussed with Dr. Watt Climes.  He will see patient in consult.  No further lab work needed at this time.    Davonna Belling, MD 02/27/19 1104

## 2019-02-27 NOTE — ED Notes (Signed)
Called [facility] to inquire delay in pt being moved to floor. Informed they were waiting for a medical bed to become available.

## 2019-02-27 NOTE — ED Provider Notes (Signed)
[facility] DEPT Provider Note  CSN: [id number] Arrival date & time: 02/27/19 2376  Chief Complaint(s) Abdominal Pain  HPI Wendy Zimmerman is a 58 y.o. female who presents to the emergency department with several weeks of intermittent epigastric abdominal pain.  Over the last several days pain has become more frequent.  She spoke with her primary care doctor who ordered an ultrasound on June 1 of the right upper quadrant which was unremarkable.  She reports that her pain has now become constant since 11 PM last night.  She reports that the pain is a cramping and stabbing pain in the epigastrium that radiates around to her back and down to her umbilicus.  Pain is severe.  No alleviating or aggravating factors.  Patient has tried taking Tylenol and Mylanta without relief.  She endorses nausea with nonbloody nonbilious emesis.  She reported one episode of diarrhea.  No known sick contacts.  No suspicious food intake.  She reports that she has been on a bland diet for the past week.  She denies any urinary symptoms.  No chest pain or shortness of breath.  No fevers or chills.  No coughing or congestion.  HPI  Past Medical History Past Medical History:  Diagnosis Date  . Bowen disease   . HTN (hypertension)   . Hyperlipidemia   . Osteoarthritis    Patient Active Problem List   Diagnosis Date Noted  . HTN (hypertension)   . Osteoarthritis   . Hyperlipidemia   . Bowen disease    Home Medication(s) Prior to Admission medications   Medication Sig Start Date End Date Taking? Authorizing Provider  Flaxseed, Linseed, (FLAX SEED OIL PO) Take 1 tablet by mouth daily.    [provider]  MAGNESIUM PO Take 1 tablet by mouth daily.    [provider]  Multiple Vitamin (MULTIVITAMIN) capsule Take 1 capsule by mouth daily.    [provider]  olmesartan-hydrochlorothiazide (BENICAR HCT) 20-12.5 MG per tablet Take 1 tablet by mouth daily.    [provider]                                                                                                                                    Past Surgical History Past Surgical History:  Procedure Laterality Date  . CATARACT EXTRACTION    . HAND SURGERY     Family History Family History  Problem Relation Age of Onset  . Cancer Other   . Hypertension Other   . CAD Other     Social History Social History   Tobacco Use  . Smoking status: Never Smoker  . Smokeless tobacco: Never Used  Substance Use Topics  . Alcohol use: Never    Frequency: Never  . Drug use: Never   Allergies Patient has no known allergies.  Review of Systems Review of Systems All other systems are reviewed and are negative for  acute change except as noted in the HPI  Physical Exam Vital Signs  I have reviewed the triage vital signs BP (!) 143/77 (BP Location: Left Arm)   Pulse (!) 113   Temp 98.7 F (37.1 C) (Oral)   Resp 20   SpO2 99%   Physical Exam Vitals signs reviewed.  Constitutional:      General: She is not in acute distress.    Appearance: She is well-developed. She is not diaphoretic.  HENT:     Head: Normocephalic and atraumatic.     Right Ear: External ear normal.     Left Ear: External ear normal.     Nose: Nose normal.  Eyes:     General: No scleral icterus.    Conjunctiva/sclera: Conjunctivae normal.  Neck:     Musculoskeletal: Normal range of motion.     Trachea: Phonation normal.  Cardiovascular:     Rate and Rhythm: Normal rate and regular rhythm.  Pulmonary:     Effort: Pulmonary effort is normal. No respiratory distress.     Breath sounds: No stridor.  Abdominal:     General: There is no distension.     Tenderness: There is abdominal tenderness in the right upper quadrant, epigastric area and left upper quadrant. There is guarding. There is no rebound. Negative signs include Murphy's sign.     Hernia: No hernia is present.  Musculoskeletal: Normal range of  motion.  Neurological:     Mental Status: She is alert and oriented to person, place, and time.  Psychiatric:        Behavior: Behavior normal.     ED Results and Treatments Labs (all labs ordered are listed, but only abnormal results are displayed) Labs Reviewed  LIPASE, BLOOD  COMPREHENSIVE METABOLIC PANEL  CBC  URINALYSIS, ROUTINE W REFLEX MICROSCOPIC                                                                                                                         EKG  EKG Interpretation  Date/Time:  Thursday February 27 2019 05:30:05 EDT Ventricular Rate:  108 PR Interval:    QRS Duration: 92 QT Interval:  345 QTC Calculation: 463 R Axis:   24 Text Interpretation:  Sinus tachycardia Probable left atrial enlargement Borderline repolarization abnormality Baseline wander in lead(s) II III aVF Confirmed by Addison Lank [phone]) on 02/27/2019 5:46:42 AM      Radiology No results found. Pertinent labs & imaging results that were available during my care of the patient were reviewed by me and considered in my medical decision making (see chart for details).  Medications Ordered in ED Medications  sodium chloride flush (NS) 0.9 % injection 3 mL (has no administration in time range)  ondansetron (ZOFRAN) injection 4 mg (has no administration in time range)  Procedures Procedures  (including critical care time)  Medical Decision Making / ED Course I have reviewed the nursing notes for this encounter and the patient's prior records (if available in EHR or on provided paperwork).    Severe abd pain with epigastric abd pain. Normal RUQ 3 days ago. Labs w/o leukocytosis. Labs with mild hypokalemia and evidence biliary obstruction. Lipase negative. No leukocytosis.  Provided with nausea and pain meds as well as IVF.  Will get CT abd to assess for  etiology given normal [place].  Patient care turned over to Dr Alvino Chapel at 0700. Patient case and results discussed in detail; please see their note for further ED managment.      This chart was dictated using voice recognition software.  Despite best efforts to proofread,  errors can occur which can change the documentation meaning.   Fatima Blank, MD 02/27/19 [phone]

## 2019-02-27 NOTE — ED Triage Notes (Signed)
Pt complains of severe abdominal pain since 11pm last night, she states she's very nauseated and had diarrhea at lunch Pt had the same episode twice last week Had a gallbladder ultrasound that was normal

## 2019-02-27 NOTE — H&P (Signed)
History and Physical        Hospital Admission Note Date: 02/27/2019  Patient name: Wendy Zimmerman Medical record number: [id number] Date of birth: [birth year] Age: 58 y.o. Gender: female  PCP: Kelton Pillar, MD    Patient coming from: Home  I have reviewed all records in the [facility].    Chief Complaint:  Intermittent episodes of severe abdominal pain in the last 1 week  HPI: Patient is a 58 year old female with history of hypertension, hyperlipidemia, Bowen's disease presented to ED with intermittent episodes of severe abdominal pain in the last 1 week.  Patient reported that the first episode of abdominal pain happened on the Memorial Day weekend, lasted less than an hour.  She had nausea and vomiting.  Subsequently she had 2 more episodes, in between the episodes, patient does not have any abdominal pain, nausea or vomiting.  Her appetite has been low in the last 1 week.  Last night she started having severe abdominal pain, described as epigastric, periumbilical and right upper quadrant, 10/10, sharp with nausea but no vomiting.  No recent loss of weight.  No fevers or chills.  No shortness of breath.  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  Patient called her PCPs office and had abdominal ultrasound done on 6/1, which was essentially normal, no gallstones or wall thickening   ED work-up showed transaminitis with elevated alk phos 263, total bilirubin 2.1, elevated AST, ALT Potassium 2.9. CT abdomen showed new mild intra-and extrahepatic biliary duct dilatation  Patient could not tolerate trial of oral liquids in ED due to significant nausea, abdominal pain.  Per patient, abdominal pain improved after IV pain medication.  Hospitalist service was requested for admission and further work-up.  ED work-up/course:  Temp 98.7, pulse 113, BP 143/77, subsequently  104/74.  Heart rate improved to 89.  Review of Systems: Positives marked in 'bold' Constitutional: Denies fever, chills, diaphoresis, poor appetite and fatigue.  HEENT: Denies photophobia, eye pain, redness, hearing loss, ear pain, congestion, sore throat, rhinorrhea, sneezing, mouth sores, trouble swallowing, neck pain, neck stiffness and tinnitus.   Respiratory: Denies SOB, DOE, cough, chest tightness,  and wheezing.   Cardiovascular: Denies chest pain, palpitations and leg swelling.  Gastrointestinal: See HPI Genitourinary: Denies dysuria, urgency, frequency, hematuria, flank pain and difficulty urinating.  Musculoskeletal: Denies myalgias, back pain, joint swelling, arthralgias and gait problem.  Skin: Denies pallor, rash and wound.  Neurological: Denies dizziness, seizures, syncope, weakness, light-headedness, numbness and headaches.  Hematological: Denies adenopathy. Easy bruising, personal or family bleeding history  Psychiatric/Behavioral: Denies suicidal ideation, mood changes, confusion, nervousness, sleep disturbance and agitation  Past Medical History: Past Medical History:  Diagnosis Date  . Bowen disease   . HTN (hypertension)   . Hyperlipidemia   . Osteoarthritis     Past Surgical History:  Procedure Laterality Date  . CATARACT EXTRACTION    . HAND SURGERY      Medications: Prior to Admission medications   Medication Sig Start Date End Date Taking? Authorizing Provider  acetaminophen (TYLENOL) 500 MG tablet Take 1,000 mg by mouth every 6 (six) hours as needed (For pain.).   Yes [provider]  aspirin EC 81 MG tablet Take 81 mg by mouth daily.   Yes [provider]  atorvastatin (LIPITOR) 20 MG tablet Take 20 mg by mouth daily.   Yes [provider]  buPROPion (WELLBUTRIN XL) 150 MG 24 hr tablet Take 150 mg by mouth every other day.   Yes [provider]  cycloSPORINE (RESTASIS) 0.05 % ophthalmic emulsion Place 1 drop into both  eyes 2 (two) times daily.   Yes [provider]  esomeprazole (NEXIUM) 20 MG capsule Take 20 mg by mouth 2 (two) times a day.   Yes [provider]  Multiple Vitamin (MULTIVITAMIN WITH MINERALS) TABS tablet Take 1 tablet by mouth daily.   Yes [provider]  valsartan-hydrochlorothiazide (DIOVAN-HCT) 320-12.5 MG tablet Take 1 tablet by mouth daily.   Yes [provider]    Allergies:   Allergies  Allergen Reactions  . Lisinopril Swelling and Other (See Comments)    Face and tongue swelling    Social History:  reports that she has never smoked. She has never used smokeless tobacco. She reports that she does not drink alcohol or use drugs.  Family History: Family History  Problem Relation Age of Onset  .  Breast cancer   mother   . Hypertension Other   . CAD Other     Physical Exam: Blood pressure 140/83, pulse 92, temperature 98.7 F (37.1 C), temperature source Oral, resp. rate (!) 21, SpO2 97 %. General: Alert, awake, oriented x3, in no acute distress. Eyes: pink conjunctiva,anicteric sclera, pupils equal and reactive to light and accomodation, HEENT: normocephalic, atraumatic, oropharynx clear Neck: supple, no masses or lymphadenopathy, no goiter, no bruits, no JVD CVS: Regular rate and rhythm, without murmurs, rubs or gallops. No lower extremity edema Resp : Clear to auscultation bilaterally, no wheezing, rales or rhonchi. GI : Soft, nontender, nondistended, positive bowel sounds, No hernia.  At the time of my examination, no abdominal tenderness.  Musculoskeletal: No clubbing or cyanosis, positive pedal pulses. No contracture. ROM intact  Neuro: Grossly intact, no focal neurological deficits, strength 5/5 upper and lower extremities bilaterally Psych: alert and oriented x 3, normal mood and affect Skin: no rashes or lesions, warm and dry   LABS on Admission: I have personally reviewed all the labs and imagings below    Basic  Metabolic Panel: Recent Labs  Lab 02/27/19 0612  NA 139  K 2.9*  CL 101  CO2 26  GLUCOSE 152*  BUN 16  CREATININE 1.11*  CALCIUM 9.1   Liver Function Tests: Recent Labs  Lab 02/27/19 0612  AST 131*  ALT 99*  ALKPHOS 263*  BILITOT 2.1*  PROT 8.0  ALBUMIN 4.3   Recent Labs  Lab 02/27/19 0612  LIPASE 35   No results for input(s): AMMONIA in the last 168 hours. CBC: Recent Labs  Lab 02/27/19 0612  WBC 10.4  HGB 13.4  HCT 42.4  MCV 86.5  PLT 296   Cardiac Enzymes: No results for input(s): CKTOTAL, CKMB, CKMBINDEX, TROPONINI in the last 168 hours. BNP: Invalid input(s): POCBNP CBG: No results for input(s): GLUCAP in the last 168 hours.  Radiological Exams on Admission:  Ct Abdomen Pelvis W Contrast  Result Date: 02/27/2019 CLINICAL DATA:  Severe abdominal pain since last night. Nausea and diarrhea EXAM: CT ABDOMEN AND PELVIS WITH CONTRAST TECHNIQUE: Multidetector CT imaging of the abdomen and pelvis was performed using the standard protocol following bolus administration of intravenous contrast. CONTRAST:  191m OMNIPAQUE  IOHEXOL 300 MG/ML  SOLN COMPARISON:  10/22/2017 FINDINGS: Lower chest: Fluid seen in the lower esophagus, presumed reflux. Mild atelectasis or scarring at the lung bases. Hepatobiliary: No focal liver abnormality.Prominent diameter of intrahepatic bile ducts. Common bile duct is also newly enlarged at 13 mm diameter on coronal reformats. No calcified choledocholithiasis or acute cholecystitis. Pancreas: Unremarkable. Spleen: Unremarkable. Adrenals/Urinary Tract: Negative adrenals. No hydronephrosis or stone. Small left renal cystic density. Unremarkable bladder. Stomach/Bowel: No obstruction. No appendicitis. Sigmoid diverticulosis. Vascular/Lymphatic: No acute vascular abnormality. No mass or adenopathy. Reproductive:Hysterectomy. Other: No ascites or pneumoperitoneum.  Fatty right inguinal hernia. Musculoskeletal: No acute abnormalities. Remote fractures  of the right hemipelvis with right sacroiliac ankylosis. IMPRESSION: 1. Mild intra and extrahepatic bile duct dilatation that is new from 2019, recommend sonographic correlation given LFTs. 2. Sigmoid diverticulosis. 3. Fatty right inguinal hernia. Electronically Signed   By: Monte Fantasia M.D.   On: 02/27/2019 08:19      EKG: Independently reviewed. *Rate 108, sinus tachycardia   Assessment/Plan Principal Problem: Epigastric and right upper quadrant abdominal pain with nausea and vomiting, transaminitis, hyperbilirubinemia: Recurrent episodes -Presenting with elevated LFTs, alk phos, hyperbilirubinemia.  CT scan with new mild intra-and extrahepatic ductal dilatation, no gallstones or choledocholithiasis. ?  Biliary dyskinesia -Placed on IV fluids, antiemetics, pain control -Likely will need further work-up possibly MRCP/ERCP.  Patient states that she cannot tolerate MRI due to significant claustrophobia. -[facility] has been consulted by EDP, Dr. Alvino Chapel, will await further recommendations from GI.   Active Problems:   HTN (hypertension) -BP currently soft, hold valsartan, HCTZ -Placed on IV fluids    Hyperlipidemia -Hold statin due to transaminitis    Hypokalemia -Potassium 2.9, placed on IV potassium replacement and with IV fluids  Hyperglycemia -Noted on the labs, no history of diabetes, obtain hemoglobin A1c  DVT prophylaxis: SCDs  CODE STATUS: Full CODE STATUS  Consults called: [facility] per Dr. Alvino Chapel  Family Communication: Admission, patients condition and plan of care including tests being ordered have been discussed with the patient  who indicates understanding and agree with the plan and Code Status  Admission status: Inpatient, MedSurg  Disposition plan: Further plan will depend as patient's clinical course evolves and further radiologic and laboratory data become available.    At the time of admission, it appears that the appropriate admission status for  this patient is INPATIENT . This is judged to be reasonable and necessary in order to provide the required intensity of service to ensure the patient's safety given the presenting symptoms intermittent episodes of severe abdominal pain with transaminitis, hyperbilirubinemia, physical exam findings, and initial radiographic and laboratory data in the context of their chronic comorbidities.  The medical decision making on this patient was of high complexity and the patient is at high risk for clinical deterioration, therefore this is a level 3 visit    Time Spent on Admission: 60 minutes    Ripudeep Rai M.D. [facility] 02/27/2019, 11:19 AM

## 2019-02-27 NOTE — ED Notes (Signed)
ED TO INPATIENT HANDOFF REPORT  ED Nurse Name and Phone #: Christinia Gully Name/Age/Gender Almas P [place] 58 y.o. female Room/Bed: WA10/WA10  Code Status   Code Status: Not on file  Home/SNF/Other Given to floor Patient oriented to: self, place, time and situation Is this baseline? Yes   Triage Complete: Triage complete  Chief Complaint Abdominal Pain  Triage Note Pt complains of severe abdominal pain since 11pm last night, she states she's very nauseated and had diarrhea at lunch Pt had the same episode twice last week Had a gallbladder ultrasound that was normal    Allergies Allergies  Allergen Reactions  . Lisinopril Swelling and Other (See Comments)    Face and tongue swelling    Level of Care/Admitting Diagnosis ED Disposition    ED Disposition Condition Comment   Admit  Hospital Area: [facility] [[id number]]  Level of Care: Med-Surg [16]  Covid Evaluation: Screening Protocol (No Symptoms)  Diagnosis: Abdominal pain [947654]  Admitting Physician: RAI, [place]  Attending Physician: RAI, RIPUDEEP K [[id number]]  Estimated length of stay: past midnight tomorrow  Certification:: I certify this patient will need inpatient services for at least 2 midnights  PT Class (Do Not Modify): Inpatient [101]  PT Acc Code (Do Not Modify): Private [1]       B Medical/Surgery History Past Medical History:  Diagnosis Date  . Bowen disease   . HTN (hypertension)   . Hyperlipidemia   . Osteoarthritis    Past Surgical History:  Procedure Laterality Date  . CATARACT EXTRACTION    . HAND SURGERY       A IV Location/Drains/Wounds Patient Lines/Drains/Airways Status   Active Line/Drains/Airways    Name:   Placement date:   Placement time:   Site:   Days:   Peripheral IV 02/27/19 Right Antecubital   02/27/19    0610    Antecubital   less than 1          Intake/Output Last 24 hours No intake or output data in the 24 hours ending 02/27/19  1931  Labs/Imaging Results for orders placed or performed during the hospital encounter of 02/27/19 (from the past 48 hour(s))  Lipase, blood     Status: None   Collection Time: 02/27/19  6:12 AM  Result Value Ref Range   Lipase 35 11 - 51 U/L    Comment: Performed at [facility], [place] [address] [place], [place] [address]  Comprehensive metabolic panel     Status: Abnormal   Collection Time: 02/27/19  6:12 AM  Result Value Ref Range   Sodium 139 135 - 145 mmol/L   Potassium 2.9 (L) 3.5 - 5.1 mmol/L   Chloride 101 98 - 111 mmol/L   CO2 26 22 - 32 mmol/L   Glucose, Bld 152 (H) 70 - 99 mg/dL   BUN 16 6 - 20 mg/dL   Creatinine, Ser 1.11 (H) 0.44 - 1.00 mg/dL   Calcium 9.1 8.9 - 10.3 mg/dL   Total Protein 8.0 6.5 - 8.1 g/dL   Albumin 4.3 3.5 - 5.0 g/dL   AST 131 (H) 15 - 41 U/L   ALT 99 (H) 0 - 44 U/L   Alkaline Phosphatase 263 (H) 38 - 126 U/L   Total Bilirubin 2.1 (H) 0.3 - 1.2 mg/dL   GFR calc non Af Amer 54 (L) >60 mL/min   GFR calc Af Amer >60 >60 mL/min   Anion gap 12 5 - 15  Comment: Performed at [facility], [place] [address], [place], [place] [address]  CBC     Status: None   Collection Time: 02/27/19  6:12 AM  Result Value Ref Range   WBC 10.4 4.0 - 10.5 K/uL   RBC 4.90 3.87 - 5.11 MIL/uL   Hemoglobin 13.4 12.0 - 15.0 g/dL   HCT 42.4 36.0 - 46.0 %   MCV 86.5 80.0 - 100.0 fL   MCH 27.3 26.0 - 34.0 pg   MCHC 31.6 30.0 - 36.0 g/dL   RDW 13.8 11.5 - 15.5 %   Platelets 296 150 - 400 K/uL   nRBC 0.0 0.0 - 0.2 %    Comment: Performed at [facility], [place] [address], [place], [place] [address]  SARS Coronavirus 2 (CEPHEID - Performed in [facility] hospital lab), Hosp Order     Status: None   Collection Time: 02/27/19 10:53 AM  Result Value Ref Range   SARS Coronavirus 2 NEGATIVE NEGATIVE    Comment: (NOTE) If result is NEGATIVE SARS-CoV-2 target nucleic acids are NOT DETECTED. The SARS-CoV-2 RNA is generally  detectable in upper and lower  respiratory specimens during the acute phase of infection. The lowest  concentration of SARS-CoV-2 viral copies this assay can detect is 250  copies / mL. A negative result does not preclude SARS-CoV-2 infection  and should not be used as the sole basis for treatment or other  patient management decisions.  A negative result may occur with  improper specimen collection / handling, submission of specimen other  than nasopharyngeal swab, presence of viral mutation(s) within the  areas targeted by this assay, and inadequate number of viral copies  (<250 copies / mL). A negative result must be combined with clinical  observations, patient history, and epidemiological information. If result is POSITIVE SARS-CoV-2 target nucleic acids are DETECTED. The SARS-CoV-2 RNA is generally detectable in upper and lower  respiratory specimens dur ing the acute phase of infection.  Positive  results are indicative of active infection with SARS-CoV-2.  Clinical  correlation with patient history and other diagnostic information is  necessary to determine patient infection status.  Positive results do  not rule out bacterial infection or co-infection with other viruses. If result is PRESUMPTIVE POSTIVE SARS-CoV-2 nucleic acids MAY BE PRESENT.   A presumptive positive result was obtained on the submitted specimen  and confirmed on repeat testing.  While 2019 novel coronavirus  (SARS-CoV-2) nucleic acids may be present in the submitted sample  additional confirmatory testing may be necessary for epidemiological  and / or clinical management purposes  to differentiate between  SARS-CoV-2 and other Sarbecovirus currently known to infect humans.  If clinically indicated additional testing with an alternate test  methodology [id number]) is advised. The SARS-CoV-2 RNA is generally  detectable in upper and lower respiratory sp ecimens during the acute  phase of infection. The  expected result is Negative. Fact Sheet for Patients:  StrictlyIdeas.no Fact Sheet for Healthcare Providers: BankingDealers.co.za This test is not yet approved or cleared by the [place] FDA and has been authorized for detection and/or diagnosis of SARS-CoV-2 by FDA under an Emergency Use Authorization (EUA).  This EUA will remain in effect (meaning this test can be used) for the duration of the COVID-19 declaration under Section 564(b)(1) of the Act, 21 U.S.C. section 360bbb-3(b)(1), unless the authorization is terminated or revoked sooner. Performed at [facility], [place] [address] [place], [place] [address]   Comprehensive metabolic  panel     Status: Abnormal   Collection Time: 02/27/19  3:07 PM  Result Value Ref Range   Sodium 139 135 - 145 mmol/L   Potassium 3.3 (L) 3.5 - 5.1 mmol/L   Chloride 106 98 - 111 mmol/L   CO2 26 22 - 32 mmol/L   Glucose, Bld 142 (H) 70 - 99 mg/dL   BUN 17 6 - 20 mg/dL   Creatinine, Ser 0.97 0.44 - 1.00 mg/dL   Calcium 8.3 (L) 8.9 - 10.3 mg/dL   Total Protein 6.9 6.5 - 8.1 g/dL   Albumin 3.5 3.5 - 5.0 g/dL   AST 236 (H) 15 - 41 U/L   ALT 174 (H) 0 - 44 U/L   Alkaline Phosphatase 250 (H) 38 - 126 U/L   Total Bilirubin 3.9 (H) 0.3 - 1.2 mg/dL   GFR calc non Af Amer >60 >60 mL/min   GFR calc Af Amer >60 >60 mL/min   Anion gap 7 5 - 15    Comment: Performed at [facility], [place] [address] [place], [place] [address]  CBC with Differential/Platelet     Status: Abnormal   Collection Time: 02/27/19  3:07 PM  Result Value Ref Range   WBC 13.0 (H) 4.0 - 10.5 K/uL   RBC 4.24 3.87 - 5.11 MIL/uL   Hemoglobin 12.0 12.0 - 15.0 g/dL   HCT 37.1 36.0 - 46.0 %   MCV 87.5 80.0 - 100.0 fL   MCH 28.3 26.0 - 34.0 pg   MCHC 32.3 30.0 - 36.0 g/dL   RDW 14.1 11.5 - 15.5 %   Platelets 250 150 - 400 K/uL   nRBC 0.0 0.0 - 0.2 %   Neutrophils Relative % 89 %   Neutro Abs 11.5  (H) 1.7 - 7.7 K/uL   Lymphocytes Relative 6 %   Lymphs Abs 0.7 0.7 - 4.0 K/uL   Monocytes Relative 5 %   Monocytes Absolute 0.7 0.1 - 1.0 K/uL   Eosinophils Relative 0 %   Eosinophils Absolute 0.0 0.0 - 0.5 K/uL   Basophils Relative 0 %   Basophils Absolute 0.0 0.0 - 0.1 K/uL   Immature Granulocytes 0 %   Abs Immature Granulocytes 0.04 0.00 - 0.07 K/uL    Comment: Performed at [facility], [place] [address] [place], [place] [address]  Urinalysis, Routine w reflex microscopic     Status: Abnormal   Collection Time: 02/27/19  5:23 PM  Result Value Ref Range   Color, Urine AMBER (A) YELLOW    Comment: BIOCHEMICALS MAY BE AFFECTED BY COLOR   APPearance CLEAR CLEAR   Specific Gravity, Urine >1.046 (H) 1.005 - 1.030   pH 5.0 5.0 - 8.0   Glucose, UA NEGATIVE NEGATIVE mg/dL   Hgb urine dipstick NEGATIVE NEGATIVE   Bilirubin Urine SMALL (A) NEGATIVE   Ketones, ur NEGATIVE NEGATIVE mg/dL   Protein, ur 30 (A) NEGATIVE mg/dL   Nitrite NEGATIVE NEGATIVE   Leukocytes,Ua NEGATIVE NEGATIVE   RBC / HPF 6-10 0 - 5 RBC/hpf   WBC, UA 0-5 0 - 5 WBC/hpf   Bacteria, UA NONE SEEN NONE SEEN   Squamous Epithelial / LPF 11-20 0 - 5   Mucus PRESENT     Comment: Performed at [facility], [place] [address] [place], [place] [address]   Ct Abdomen Pelvis W Contrast  Result Date: 02/27/2019 CLINICAL DATA:  Severe abdominal pain since last night. Nausea and diarrhea EXAM: CT ABDOMEN AND PELVIS  WITH CONTRAST TECHNIQUE: Multidetector CT imaging of the abdomen and pelvis was performed using the standard protocol following bolus administration of intravenous contrast. CONTRAST:  132mL OMNIPAQUE IOHEXOL 300 MG/ML  SOLN COMPARISON:  10/22/2017 FINDINGS: Lower chest: Fluid seen in the lower esophagus, presumed reflux. Mild atelectasis or scarring at the lung bases. Hepatobiliary: No focal liver abnormality.Prominent diameter of intrahepatic bile ducts. Common bile duct is also newly  enlarged at 13 mm diameter on coronal reformats. No calcified choledocholithiasis or acute cholecystitis. Pancreas: Unremarkable. Spleen: Unremarkable. Adrenals/Urinary Tract: Negative adrenals. No hydronephrosis or stone. Small left renal cystic density. Unremarkable bladder. Stomach/Bowel: No obstruction. No appendicitis. Sigmoid diverticulosis. Vascular/Lymphatic: No acute vascular abnormality. No mass or adenopathy. Reproductive:Hysterectomy. Other: No ascites or pneumoperitoneum.  Fatty right inguinal hernia. Musculoskeletal: No acute abnormalities. Remote fractures of the right hemipelvis with right sacroiliac ankylosis. IMPRESSION: 1. Mild intra and extrahepatic bile duct dilatation that is new from 2019, recommend sonographic correlation given LFTs. 2. Sigmoid diverticulosis. 3. Fatty right inguinal hernia. Electronically Signed   By: Monte Fantasia M.D.   On: 02/27/2019 08:19    Pending Labs [employer] (From admission, onward)    Start     Ordered   Signed and Held  HIV antibody (Routine Testing)  Once,   R     Signed and Held   Signed and Held  CBC  Tomorrow morning,   R     Signed and Held   Signed and Held  Comprehensive metabolic panel  Tomorrow morning,   R     Signed and Held   Signed and Held  Novel Coronavirus, NAA (hospital order; send-out to ref lab)  Once,   R    Comments:  No isolation needed for this testing (if isolation ordered for another indication, maintain current isolation).   Question:  Pre-procedural testing  Answer:  Yes   Signed and Held   Signed and Held  Hemoglobin A1c  Tomorrow morning,   R     Signed and Held          Vitals/Pain Today's Vitals   02/27/19 1430 02/27/19 1510 02/27/19 1645 02/27/19 1730  BP: 111/75  116/63 110/70  Pulse: 81  84 76  Resp: 14  (!) 21 15  Temp:      TempSrc:      SpO2: 96%  97% 96%  PainSc:  0-No pain      Isolation Precautions No active isolations  Medications Medications  sodium chloride flush (NS) 0.9  % injection 3 mL (3 mLs Intravenous Not Given 02/27/19 0708)  sodium chloride (PF) 0.9 % injection (has no administration in time range)  dextrose 5 % and 0.2 % NaCl with KCl 20 mEq infusion ( Intravenous New Bag/Given 02/27/19 1430)  ondansetron (ZOFRAN) injection 4 mg (4 mg Intravenous Given 02/27/19 0626)  sodium chloride 0.9 % bolus 1,000 mL (0 mLs Intravenous Stopped 02/27/19 0727)  HYDROmorphone (DILAUDID) injection 1 mg (1 mg Intravenous Given 02/27/19 0626)  HYDROmorphone (DILAUDID) injection 1 mg (1 mg Intravenous Given 02/27/19 0649)  iohexol (OMNIPAQUE) 300 MG/ML solution 100 mL (100 mLs Intravenous Contrast Given 02/27/19 0743)  ondansetron (ZOFRAN) injection 4 mg (4 mg Intravenous Given 02/27/19 1052)  potassium chloride 10 mEq in 100 mL IVPB (0 mEq Intravenous Stopped 02/27/19 1430)    Mobility walks     Focused Assessments Abdominal assessment   R Recommendations: See Admitting Provider Note  Report given to:   Additional Notes:

## 2019-02-27 NOTE — ED Notes (Signed)
GI Provider at bedside. 

## 2019-02-28 ENCOUNTER — Inpatient Hospital Stay (HOSPITAL_COMMUNITY): Payer: 59 | Admitting: Anesthesiology

## 2019-02-28 ENCOUNTER — Encounter (HOSPITAL_COMMUNITY): Payer: Self-pay | Admitting: Emergency Medicine

## 2019-02-28 ENCOUNTER — Inpatient Hospital Stay (HOSPITAL_COMMUNITY): Payer: 59

## 2019-02-28 ENCOUNTER — Encounter (HOSPITAL_COMMUNITY)
Admission: EM | Disposition: A | Discharge status: Home / Self Care | Payer: Self-pay | Source: Home / Self Care | Attending: Internal Medicine

## 2019-02-28 DIAGNOSIS — E7800 Pure hypercholesterolemia, unspecified: Secondary | ICD-10-CM

## 2019-02-28 DIAGNOSIS — E876 Hypokalemia: Secondary | ICD-10-CM

## 2019-02-28 DIAGNOSIS — I10 Essential (primary) hypertension: Secondary | ICD-10-CM

## 2019-02-28 DIAGNOSIS — R740 Nonspecific elevation of levels of transaminase and lactic acid dehydrogenase [LDH]: Secondary | ICD-10-CM

## 2019-02-28 DIAGNOSIS — R1010 Upper abdominal pain, unspecified: Secondary | ICD-10-CM

## 2019-02-28 HISTORY — PX: SPHINCTEROTOMY: SHX5544

## 2019-02-28 HISTORY — PX: ERCP: SHX5425

## 2019-02-28 LAB — COMPREHENSIVE METABOLIC PANEL
ALT: 162 U/L — ABNORMAL HIGH (ref 0–44)
AST: 163 U/L — ABNORMAL HIGH (ref 15–41)
Albumin: 3.4 g/dL — ABNORMAL LOW (ref 3.5–5.0)
Alkaline Phosphatase: 258 U/L — ABNORMAL HIGH (ref 38–126)
Anion gap: 9 (ref 5–15)
BUN: 12 mg/dL (ref 6–20)
CO2: 24 mmol/L (ref 22–32)
Calcium: 8.3 mg/dL — ABNORMAL LOW (ref 8.9–10.3)
Chloride: 105 mmol/L (ref 98–111)
Creatinine, Ser: 0.81 mg/dL (ref 0.44–1.00)
GFR calc Af Amer: >60 mL/min (ref >60)
GFR calc non Af Amer: >60 mL/min (ref >60)
Glucose, Bld: 134 mg/dL — ABNORMAL HIGH (ref 70–99)
Potassium: 3.4 mmol/L — ABNORMAL LOW (ref 3.5–5.1)
Sodium: 138 mmol/L (ref 135–145)
Total Bilirubin: 4.5 mg/dL — ABNORMAL HIGH (ref 0.3–1.2)
Total Protein: 6.7 g/dL (ref 6.5–8.1)

## 2019-02-28 LAB — CBC
HCT: 38.5 % (ref 36.0–46.0)
Hemoglobin: 11.8 g/dL — ABNORMAL LOW (ref 12.0–15.0)
MCH: 27.7 pg (ref 26.0–34.0)
MCHC: 30.6 g/dL (ref 30.0–36.0)
MCV: 90.4 fL (ref 80.0–100.0)
Platelets: 249 10*3/uL (ref 150–400)
RBC: 4.26 MIL/uL (ref 3.87–5.11)
RDW: 14.2 % (ref 11.5–15.5)
WBC: 8.1 10*3/uL (ref 4.0–10.5)
nRBC: 0 % (ref 0.0–0.2)

## 2019-02-28 LAB — HEMOGLOBIN A1C
Hgb A1c MFr Bld: 5.4 % (ref 4.8–5.6)
Mean Plasma Glucose: 108.28 mg/dL

## 2019-02-28 LAB — HIV ANTIBODY (ROUTINE TESTING W REFLEX): HIV Screen 4th Generation wRfx: NONREACTIVE

## 2019-02-28 LAB — SURGICAL PCR SCREEN
MRSA, PCR: NEGATIVE
Staphylococcus aureus: POSITIVE — AB

## 2019-02-28 SURGERY — ERCP, WITH INTERVENTION IF INDICATED
Anesthesia: General

## 2019-02-28 MED ORDER — ONDANSETRON HCL 4 MG/2ML IJ SOLN: 4.0000 mg | INTRAMUSCULAR | Status: DC | PRN

## 2019-02-28 MED ORDER — CIPROFLOXACIN IN D5W 400 MG/200ML IV SOLN
INTRAVENOUS | Status: DC | PRN
  Administered 2019-02-28: 400 mg via INTRAVENOUS

## 2019-02-28 MED ORDER — SUGAMMADEX SODIUM 200 MG/2ML IV SOLN
INTRAVENOUS | Status: DC | PRN
  Administered 2019-02-28: 400 mg via INTRAVENOUS

## 2019-02-28 MED ORDER — CYCLOSPORINE 0.05 % OP EMUL
1.0000 [drp] | Freq: Two times a day (BID) | OPHTHALMIC | Status: DC
  Administered 2019-02-28 – 2019-03-02 (×6): 1 [drp] via OPHTHALMIC

## 2019-02-28 MED ORDER — PROPOFOL 10 MG/ML IV BOLUS
INTRAVENOUS | Status: DC | PRN
  Administered 2019-02-28: 150 mg via INTRAVENOUS

## 2019-02-28 MED ORDER — ONDANSETRON HCL 4 MG PO TABS: 4.0000 mg | ORAL_TABLET | ORAL | Status: DC | PRN

## 2019-02-28 MED ORDER — CIPROFLOXACIN IN D5W 200 MG/100ML IV SOLN: 200.0000 mg | Freq: Two times a day (BID) | INTRAVENOUS | Status: DC

## 2019-02-28 MED ORDER — PHENYLEPHRINE 40 MCG/ML (10ML) SYRINGE FOR IV PUSH (FOR BLOOD PRESSURE SUPPORT)
PREFILLED_SYRINGE | INTRAVENOUS | Status: DC | PRN
  Administered 2019-02-28 (×3): 100 ug via INTRAVENOUS

## 2019-02-28 MED ORDER — CIPROFLOXACIN IN D5W 400 MG/200ML IV SOLN
INTRAVENOUS | Status: AC
  Filled 2019-02-28: qty 200

## 2019-02-28 MED ORDER — CIPROFLOXACIN IN D5W 400 MG/200ML IV SOLN: 400.0000 mg | Freq: Two times a day (BID) | INTRAVENOUS | Status: DC

## 2019-02-28 MED ORDER — ROCURONIUM BROMIDE 10 MG/ML (PF) SYRINGE
PREFILLED_SYRINGE | INTRAVENOUS | Status: DC | PRN
  Administered 2019-02-28: 50 mg via INTRAVENOUS

## 2019-02-28 MED ORDER — PROMETHAZINE HCL 25 MG/ML IJ SOLN
12.5000 mg | Freq: Once | INTRAMUSCULAR | Status: AC
  Administered 2019-02-28: 12.5 mg via INTRAVENOUS
  Filled 2019-02-28: qty 1

## 2019-02-28 MED ORDER — SODIUM CHLORIDE 0.9 % IV SOLN
INTRAVENOUS | Status: DC | PRN
  Administered 2019-02-28: 40 ug/min via INTRAVENOUS

## 2019-02-28 MED ORDER — SODIUM CHLORIDE 0.9 % IV SOLN: INTRAVENOUS | Status: DC

## 2019-02-28 MED ORDER — PROPOFOL 10 MG/ML IV BOLUS
INTRAVENOUS | Status: AC
  Filled 2019-02-28: qty 20

## 2019-02-28 MED ORDER — SODIUM CHLORIDE 0.9 % IV SOLN: INTRAVENOUS | Status: DC | PRN

## 2019-02-28 MED ORDER — FENTANYL CITRATE (PF) 100 MCG/2ML IJ SOLN
INTRAMUSCULAR | Status: DC | PRN
  Administered 2019-02-28: 100 ug via INTRAVENOUS

## 2019-02-28 MED ORDER — MIDAZOLAM HCL 5 MG/5ML IJ SOLN
INTRAMUSCULAR | Status: DC | PRN
  Administered 2019-02-28: 2 mg via INTRAVENOUS

## 2019-02-28 MED ORDER — GLUCAGON HCL RDNA (DIAGNOSTIC) 1 MG IJ SOLR
INTRAMUSCULAR | Status: AC
  Filled 2019-02-28: qty 1

## 2019-02-28 MED ORDER — HYDROMORPHONE HCL 1 MG/ML IJ SOLN
1.0000 mg | INTRAMUSCULAR | Status: DC | PRN
  Administered 2019-02-28 – 2019-03-03 (×3): 1 mg via INTRAVENOUS
  Filled 2019-02-28 (×3): qty 1

## 2019-02-28 MED ORDER — SODIUM CHLORIDE 0.9 % IV SOLN
INTRAVENOUS | Status: DC | PRN
  Administered 2019-02-28: 30 mL

## 2019-02-28 MED ORDER — CIPROFLOXACIN IN D5W 400 MG/200ML IV SOLN
400.0000 mg | Freq: Two times a day (BID) | INTRAVENOUS | Status: DC
  Administered 2019-02-28 – 2019-03-02 (×4): 400 mg via INTRAVENOUS
  Filled 2019-02-28 (×4): qty 200

## 2019-02-28 MED ORDER — TRAZODONE HCL 50 MG PO TABS: 50.0000 mg | ORAL_TABLET | Freq: Once | ORAL | Status: DC

## 2019-02-28 MED ORDER — INDOMETHACIN 50 MG RE SUPP
RECTAL | Status: AC
  Filled 2019-02-28: qty 2

## 2019-02-28 MED ORDER — MIDAZOLAM HCL 2 MG/2ML IJ SOLN
INTRAMUSCULAR | Status: AC
  Filled 2019-02-28: qty 2

## 2019-02-28 MED ORDER — FENTANYL CITRATE (PF) 100 MCG/2ML IJ SOLN
INTRAMUSCULAR | Status: AC
  Filled 2019-02-28: qty 2

## 2019-02-28 MED ORDER — LACTATED RINGERS IV SOLN
INTRAVENOUS | Status: AC | PRN
  Administered 2019-02-28: 1000 mL via INTRAVENOUS

## 2019-02-28 MED ORDER — LACTATED RINGERS IV SOLN
INTRAVENOUS | Status: DC | PRN
  Administered 2019-02-28: 12:00:00 via INTRAVENOUS

## 2019-02-28 MED ORDER — MUPIROCIN 2 % EX OINT
TOPICAL_OINTMENT | Freq: Two times a day (BID) | CUTANEOUS | Status: DC
  Administered 2019-02-28: 17:00:00 via NASAL
  Administered 2019-02-28: 1 via NASAL
  Administered 2019-03-01 – 2019-03-02 (×3): via NASAL
  Administered 2019-03-02: 1 via NASAL
  Filled 2019-02-28: qty 22

## 2019-02-28 NOTE — Progress Notes (Signed)
Wendy Zimmerman 12:17 PM patient with more pain in  Subjective: Patient with more pain and nausea but no other new complaints and we rediscussed her procedure and her family has no questions  Objective: Vital signs stable afebrile no acute distress exam please see preassessment evaluation LFTs about the same CBC okay  Assessment: Probable CBD stones  Plan: Okay to proceed with ERCP with anesthesia assistance  [facility] E  office [phone] After 5PM or if no answer call [phone]

## 2019-02-28 NOTE — Anesthesia Preprocedure Evaluation (Addendum)
Anesthesia Evaluation  Patient identified by MRN, date of birth, ID band Patient awake    Reviewed: Allergy & Precautions, NPO status , Patient's Chart, lab work & pertinent test results  History of Anesthesia Complications (+) PONV and history of anesthetic complications  Airway Mallampati: III  TM Distance: >3 FB Neck ROM: Full    Dental  (+) Dental Advisory Given   Pulmonary neg pulmonary ROS,    breath sounds clear to auscultation       Cardiovascular hypertension, Pt. on medications (-) angina Rhythm:Regular Rate:Normal     Neuro/Psych negative neurological ROS  negative psych ROS   GI/Hepatic GERD  Medicated and Controlled, Elevated LFTs    Endo/Other   Obesity   Renal/GU negative Renal ROS     Musculoskeletal  (+) Arthritis , Osteoarthritis,    Abdominal   Peds  Hematology negative hematology ROS (+)   Anesthesia Other Findings   Reproductive/Obstetrics                            Anesthesia Physical Anesthesia Plan  ASA: II  Anesthesia Plan: General   Post-op Pain Management:    Induction: Intravenous  PONV Risk Score and Plan: 3 and Treatment may vary due to age or medical condition, Ondansetron, Dexamethasone and Midazolam  Airway Management Planned: Oral ETT  Additional Equipment: None  Intra-op Plan:   Post-operative Plan: Extubation in OR  Informed Consent: I have reviewed the patients History and Physical, chart, labs and discussed the procedure including the risks, benefits and alternatives for the proposed anesthesia with the patient or authorized representative who has indicated his/her understanding and acceptance.     Dental advisory given  Plan Discussed with: CRNA and Anesthesiologist  Anesthesia Plan Comments:        Anesthesia Quick Evaluation

## 2019-02-28 NOTE — Anesthesia Procedure Notes (Signed)
Procedure Name: Intubation Date/Time: 02/28/2019 12:30 PM Performed by: Anne Fu, CRNA Pre-anesthesia Checklist: Patient identified, Emergency Drugs available, Suction available, Patient being monitored and Timeout performed Patient Re-evaluated:Patient Re-evaluated prior to induction Oxygen Delivery Method: Circle system utilized Preoxygenation: Pre-oxygenation with 100% oxygen Induction Type: IV induction Ventilation: Mask ventilation without difficulty Laryngoscope Size: Mac and 4 Grade View: Grade II Tube type: Oral Tube size: 7.5 mm Number of attempts: 1 Airway Equipment and Method: Stylet Placement Confirmation: ETT inserted through vocal cords under direct vision,  positive ETCO2 and breath sounds checked- equal and bilateral Secured at: 21 cm Tube secured with: Tape Dental Injury: Teeth and Oropharynx as per pre-operative assessment

## 2019-02-28 NOTE — Op Note (Signed)
[facility] Patient Name: Wendy Zimmerman Procedure Date: 02/28/2019 MRN: [id number] Attending MD: Clarene Essex , MD Date of Birth: 1961/06/05 CSN: [id number] Age: 58 Admit Type: Inpatient Procedure:                ERCP Indications:              Suspected bile duct stone(s) Providers:                Clarene Essex, MD, Cleda Daub, RN, Cherylynn Ridges,                            Technician, Anne Fu CRNA, CRNA Referring MD:              Medicines:                General Anesthesia Complications:            No immediate complications. Estimated Blood Loss:     Estimated blood loss: none. Procedure:                Pre-Anesthesia Assessment:                           - Prior to the procedure, a History and Physical                            was performed, and patient medications and                            allergies were reviewed. The patient's tolerance of                            previous anesthesia was also reviewed. The risks                            and benefits of the procedure and the sedation                            options and risks were discussed with the patient.                            All questions were answered, and informed consent                            was obtained. Prior Anticoagulants: The patient has                            taken no previous anticoagulant or antiplatelet                            agents. ASA Grade Assessment: II - A patient with                            mild systemic disease. After reviewing the risks  and benefits, the patient was deemed in                            satisfactory condition to undergo the procedure.                           After obtaining informed consent, the scope was                            passed under direct vision. Throughout the                            procedure, the patient's blood pressure, pulse, and                            oxygen saturations were  monitored continuously. The                            [id number]-Q180V ([id number] Olympus duodenoscope was                            introduced through the mouth, and used to inject                            contrast into and used to cannulate the bile duct.                            The ERCP was accomplished without difficulty. The                            patient tolerated the procedure well. Scope In: Scope Out: Findings:      The major papilla was located entirely within a diverticulum. The major       papilla was bulging. Deep selective cannulation was obtained on the       first try and on initial cholangiogram there appeared to be possible       stones versus debris sludge versus air bubbles and we proceeded with a       biliary sphincterotomy which was made with a Hydratome sphincterotome       using ERBE electrocautery. There was no post-sphincterotomy bleeding. We       proceeded until we had adequate biliary drainage and could get the fully       bowed sphincterotome easily in and out of the duct. the common bile duct       contained filling defect(s) thought to be a stone versus air bubbles as       above and the duct was slightly dilated. The biliary tree was swept with       an adjustable 12- 15 mm balloon starting at the bifurcation. Sludge was       swept from the duct. Pus was swept from the duct. However no stones were       delivered and we proceeded with a few occlusion cholangiograms which did       not show any residual filling defects nor any cystic duct filling and       both sides  balloons passed readily through the patent sphincterotomy       site and there was adequate biliary drainage and there was no pancreatic       duct injection or wire advancement throughout the procedure and the       balloon catheter wire and scope were removed and the patient tolerated       the procedure well Impression:               - The major papilla was located entirely within a                             diverticulum.                           - The major papilla appeared to be bulging.                           - A filling defect consistent with a stone was seen                            on the cholangiogram.                           - Sludge debris and pus was found. And removed.                           - A biliary sphincterotomy was performed.                           - The biliary tree was swept and sludge and pus                            were found.There was no pancreatic duct injection                            or wire advancementAnd there was adequate biliary                            drainage Moderate Sedation:      Not Applicable - Patient had care per Anesthesia. Recommendation:           - Clear liquid diet for 6 hours.                           - Continue present medications.                           - Cipro (ciprofloxacin) 400 mg IV q 12 hr for 5                            days. May change to p.o. sooner                           - Return to GI clinic PRN.                           -  Telephone GI clinic if symptomatic PRN. Recheck                            liver tests in a.m. and if better but pain                            continues consult surgery for probable                            cholecystectomy although they may want a HIDA scan                            to confirm Procedure Code(s):        --- Professional ---                           [id number], Endoscopic retrograde                            cholangiopancreatography (ERCP); with removal of                            calculi/debris from biliary/pancreatic duct(s)                           43262, Endoscopic retrograde                            cholangiopancreatography (ERCP); with                            sphincterotomy/papillotomy Diagnosis Code(s):        --- Professional ---                           K80.50, Calculus of bile duct without cholangitis                             or cholecystitis without obstruction                           K83.8, Other specified diseases of biliary tract                           R93.2, Abnormal findings on diagnostic imaging of                            liver and biliary tract CPT copyright 2019 [employer]. All rights reserved. The codes documented in this report are preliminary and upon coder review may  be revised to meet current compliance requirements. Clarene Essex, MD 02/28/2019 1:20:15 PM This report has been signed electronically. Number of Addenda: 0

## 2019-02-28 NOTE — Progress Notes (Signed)
PROGRESS NOTE    Wendy Zimmerman  YQM:[id number] DOB: [birth year] DOA: 02/27/2019 PCP: Kelton Pillar, MD    Brief Narrative:  58 year old female who presented with severe abdominal pain.  She does have significant past medical history for hypertension, dyslipidemia, and Bowen's disease.  Reported 7 days of severe abdominal pain, associated with nausea, vomiting, and low appetite.  Pain localized in the epigastric, periumbilical, right upper quadrant.  On her initial physical examination her temperature was 98.7, heart rate 113, blood pressure 143/77, her lungs are clear to auscultation bilaterally, heart S1-S2 present and rhythmic, her abdomen was soft nontender, no lower extremity edema.  Sodium 139, potassium 3.3, chloride 106, bicarb 26, glucose 142, BUN 17, creatinine 1.97, AST 236, ALT 174, total bilirubins 3.9, white count 13.0, hemoglobin 12.0, hematocrit 37.1, platelets 250.  SARS COVID-19 was negative.  Urinalysis with specific gravity >1.046.  CT of the abdomen with mild intra-and extrahepatic bile duct dilatation, sigmoid diverticulosis, fatty right inguinal hernia.  Right upper quadrant ultrasound with no acute changes.  EKG 108 bpm, normal axis, normal intervals, sinus rhythm with normal conduction, no ST segment or T wave changes.  Patient was admitted to the hospital with a working diagnosis of abdominal pain likely related to bilairy dilatation, complicated with elevated transaminases and hyperbilirubinemia.   Assessment & Plan:   Principal Problem:   Abdominal pain Active Problems:   HTN (hypertension)   Hyperlipidemia   Hypokalemia   Nausea and vomiting   Transaminitis  1. Biliary duct dilatation, to rule out choledocholithiasis. Patient continue to have significant abdominal pain, her total bilirubins worsening at 4,5, AST 163 and ALT 163 persistent elevation, along with alkaline phophatase 258.  No leukocytosis or fever. Will increase IV hydromorphone to as needed q 2 H, will  continue as needed antiemetics with zofran q 4 H, one extra dose of phenergan this am. Continue pantoprazole. Continue IV fluids (d5 0.45% NS 20 kcl)and follow with ERCP results. Follow hepatic panel in am.   2. HTN. Continue blood pressure monitoring.  3. Hypokalemia. Continue K correction with Kc, renal function preserved with serum cr at 0.81.   4. Dyslipidemia. Continue statin therapy.  5. Obesity. BMI is 37,9.     DVT prophylaxis: enoxaparin   Code Status: full Family Communication: no family at the bedside  Disposition Plan/ discharge barriers: pending ERCP result.   Body mass index is 37.91 kg/m. Malnutrition Type:      Malnutrition Characteristics:      Nutrition Interventions:     RN Pressure Injury Documentation:     Consultants:   GI   Procedures:   ERCP  Antimicrobials:       Subjective: Patient continue to have severe epigastric and right upper abdominal pain, sharp in nature, 10/10 in intensity, with no radiation, improved with analgesics and associated with nausea.   Objective: Vitals:   02/27/19 2033 02/27/19 2145 02/28/19 0053 02/28/19 0520  BP:  126/69  111/66  Pulse:  70  68  Resp:  16  16  Temp:  98.2 F (36.8 C) 97.9 F (36.6 C) 98.4 F (36.9 C)  TempSrc:  Oral Oral   SpO2:  95%  97%  Weight: 97.1 kg     Height: 5\' 3"  (1.6 m)       Intake/Output Summary (Last 24 hours) at 02/28/2019 1026 Last data filed at 02/28/2019 0642 Gross per 24 hour  Intake 1161.99 ml  Output 1000 ml  Net 161.99 ml   [employer]  02/27/19 2033  Weight: 97.1 kg    Examination:   General: deconditioned and in pain.  Neurology: Awake and alert, non focal  E ENT: mild pallor, no icterus, oral mucosa dry Cardiovascular: No JVD. S1-S2 present, rhythmic, no gallops, rubs, or murmurs. No lower extremity edema. Pulmonary: vesicular breath sounds bilaterally, adequate air movement, no wheezing, rhonchi or rales. Gastrointestinal. Abdomen with, no  organomegaly, mild distended, no rebound or guarding. Tender to palpation at the epigastric region.  Skin. No rashes Musculoskeletal: no joint deformities     Data Reviewed: I have personally reviewed following labs and imaging studies  CBC: Recent Labs  Lab 02/27/19 0612 02/27/19 1507 02/28/19 0405  WBC 10.4 13.0* 8.1  NEUTROABS  --  11.5*  --   HGB 13.4 12.0 11.8*  HCT 42.4 37.1 38.5  MCV 86.5 87.5 90.4  PLT 296 250 735   Basic Metabolic Panel: Recent Labs  Lab 02/27/19 0612 02/27/19 1507 02/28/19 0405  NA 139 139 138  K 2.9* 3.3* 3.4*  CL 101 106 105  CO2 26 26 24   GLUCOSE 152* 142* 134*  BUN 16 17 12   CREATININE 1.11* 0.97 0.81  CALCIUM 9.1 8.3* 8.3*   GFR: Estimated Creatinine Clearance: 82 mL/min (by C-G formula based on SCr of 0.81 mg/dL). Liver Function Tests: Recent Labs  Lab 02/27/19 0612 02/27/19 1507 02/28/19 0405  AST 131* 236* 163*  ALT 99* 174* 162*  ALKPHOS 263* 250* 258*  BILITOT 2.1* 3.9* 4.5*  PROT 8.0 6.9 6.7  ALBUMIN 4.3 3.5 3.4*   Recent Labs  Lab 02/27/19 0612  LIPASE 35   No results for input(s): AMMONIA in the last 168 hours. Coagulation Profile: No results for input(s): INR, PROTIME in the last 168 hours. Cardiac Enzymes: No results for input(s): CKTOTAL, CKMB, CKMBINDEX, TROPONINI in the last 168 hours. BNP (last 3 results) No results for input(s): PROBNP in the last 8760 hours. HbA1C: Recent Labs    02/28/19 0405  HGBA1C 5.4   CBG: No results for input(s): GLUCAP in the last 168 hours. Lipid Profile: No results for input(s): CHOL, HDL, LDLCALC, TRIG, CHOLHDL, LDLDIRECT in the last 72 hours. Thyroid Function Tests: No results for input(s): TSH, T4TOTAL, FREET4, T3FREE, THYROIDAB in the last 72 hours. Anemia Panel: No results for input(s): VITAMINB12, FOLATE, FERRITIN, TIBC, IRON, RETICCTPCT in the last 72 hours.    Radiology Studies: I have reviewed all of the imaging during this hospital visit personally      Scheduled Meds: . buPROPion  150 mg Oral QODAY  . cycloSPORINE  1 drop Both Eyes BID  . mupirocin ointment   Nasal BID  . pantoprazole  40 mg Oral Q0600  . sodium chloride flush  3 mL Intravenous Once  . traZODone  50 mg Oral Once   Continuous Infusions: . dextrose 5 % and 0.2 % NaCl with KCl 20 mEq 75 mL/hr at 02/27/19 2137     LOS: 1 day         Gerome Apley, MD

## 2019-02-28 NOTE — Transfer of Care (Signed)
Immediate Anesthesia Transfer of Care Note  Patient: Deshanda P Shearon  Procedure(s) Performed: Procedure(s) with comments: ENDOSCOPIC RETROGRADE CHOLANGIOPANCREATOGRAPHY (ERCP) (N/A) SPHINCTEROTOMY - balloon sweep  Patient Location: PACU  Anesthesia Type:General  Level of Consciousness:  sedated, patient cooperative and responds to stimulation  Airway & Oxygen Therapy:Patient Spontanous Breathing and Patient connected to face mask oxgen  Post-op Assessment:  Report given to PACU RN and Post -op Vital signs reviewed and stable  Post vital signs:  Reviewed and stable  Last Vitals:  Vitals:   02/28/19 1139 02/28/19 1331  BP: 140/74 130/65  Pulse: 75 77  Resp: 11 12  Temp: 36.8 C 36.6 C  SpO2: 937% 16%    Complications: No apparent anesthesia complications

## 2019-03-01 DIAGNOSIS — K8050 Calculus of bile duct without cholangitis or cholecystitis without obstruction: Secondary | ICD-10-CM

## 2019-03-01 LAB — COMPREHENSIVE METABOLIC PANEL
ALT: 137 U/L — ABNORMAL HIGH (ref 0–44)
AST: 108 U/L — ABNORMAL HIGH (ref 15–41)
Albumin: 3.5 g/dL (ref 3.5–5.0)
Alkaline Phosphatase: 285 U/L — ABNORMAL HIGH (ref 38–126)
Anion gap: 7 (ref 5–15)
BUN: 10 mg/dL (ref 6–20)
CO2: 26 mmol/L (ref 22–32)
Calcium: 8.5 mg/dL — ABNORMAL LOW (ref 8.9–10.3)
Chloride: 108 mmol/L (ref 98–111)
Creatinine, Ser: 0.98 mg/dL (ref 0.44–1.00)
GFR calc Af Amer: >60 mL/min (ref >60)
GFR calc non Af Amer: >60 mL/min (ref >60)
Glucose, Bld: 133 mg/dL — ABNORMAL HIGH (ref 70–99)
Potassium: 3.5 mmol/L (ref 3.5–5.1)
Sodium: 141 mmol/L (ref 135–145)
Total Bilirubin: 4.1 mg/dL — ABNORMAL HIGH (ref 0.3–1.2)
Total Protein: 7.1 g/dL (ref 6.5–8.1)

## 2019-03-01 LAB — CBC WITH DIFFERENTIAL/PLATELET
Abs Immature Granulocytes: 0.07 10*3/uL (ref 0.00–0.07)
Basophils Absolute: 0 10*3/uL (ref 0.0–0.1)
Basophils Relative: 0 %
Eosinophils Absolute: 0 10*3/uL (ref 0.0–0.5)
Eosinophils Relative: 0 %
HCT: 36.8 % (ref 36.0–46.0)
Hemoglobin: 11.8 g/dL — ABNORMAL LOW (ref 12.0–15.0)
Immature Granulocytes: 1 %
Lymphocytes Relative: 11 %
Lymphs Abs: 0.8 10*3/uL (ref 0.7–4.0)
MCH: 28.4 pg (ref 26.0–34.0)
MCHC: 32.1 g/dL (ref 30.0–36.0)
MCV: 88.7 fL (ref 80.0–100.0)
Monocytes Absolute: 0.4 10*3/uL (ref 0.1–1.0)
Monocytes Relative: 6 %
Neutro Abs: 6 10*3/uL (ref 1.7–7.7)
Neutrophils Relative %: 82 %
Platelets: 258 10*3/uL (ref 150–400)
RBC: 4.15 MIL/uL (ref 3.87–5.11)
RDW: 14.4 % (ref 11.5–15.5)
WBC: 7.3 10*3/uL (ref 4.0–10.5)
nRBC: 0 % (ref 0.0–0.2)

## 2019-03-01 LAB — HEPATIC FUNCTION PANEL
ALT: 135 U/L — ABNORMAL HIGH (ref 0–44)
AST: 108 U/L — ABNORMAL HIGH (ref 15–41)
Albumin: 3.5 g/dL (ref 3.5–5.0)
Alkaline Phosphatase: 286 U/L — ABNORMAL HIGH (ref 38–126)
Bilirubin, Direct: 2.8 mg/dL — ABNORMAL HIGH (ref 0.0–0.2)
Indirect Bilirubin: 1.2 mg/dL — ABNORMAL HIGH (ref 0.3–0.9)
Total Bilirubin: 4 mg/dL — ABNORMAL HIGH (ref 0.3–1.2)
Total Protein: 7 g/dL (ref 6.5–8.1)

## 2019-03-01 MED ORDER — METRONIDAZOLE 500 MG PO TABS
500.0000 mg | ORAL_TABLET | Freq: Three times a day (TID) | ORAL | Status: DC
  Administered 2019-03-01 – 2019-03-02 (×3): 500 mg via ORAL
  Filled 2019-03-01 (×3): qty 1

## 2019-03-01 NOTE — Progress Notes (Addendum)
PROGRESS NOTE    Wendy Zimmerman  XBL:[id number] DOB: 01-Jul-1961 DOA: 02/27/2019 PCP: Kelton Pillar, MD    Brief Narrative:  58 year old female who presented with severe abdominal pain.  She does have significant past medical history for hypertension, dyslipidemia, and Bowen's disease.  Reported 7 days of severe abdominal pain, associated with nausea, vomiting, and low appetite.  Pain localized in the epigastric, periumbilical, right upper quadrant.  On her initial physical examination her temperature was 98.7, heart rate 113, blood pressure 143/77, her lungs are clear to auscultation bilaterally, heart S1-S2 present and rhythmic, her abdomen was soft nontender, no lower extremity edema.  Sodium 139, potassium 3.3, chloride 106, bicarb 26, glucose 142, BUN 17, creatinine 1.97, AST 236, ALT 174, total bilirubins 3.9, white count 13.0, hemoglobin 12.0, hematocrit 37.1, platelets 250.  SARS COVID-19 was negative.  Urinalysis with specific gravity >1.046.  CT of the abdomen with mild intra-and extrahepatic bile duct dilatation, sigmoid diverticulosis, fatty right inguinal hernia.  Right upper quadrant ultrasound with no acute changes.  EKG 108 bpm, normal axis, normal intervals, sinus rhythm with normal conduction, no ST segment or T wave changes.  Patient was admitted to the hospital with a working diagnosis of abdominal pain likely related to bilairy dilatation, complicated with elevated transaminases and hyperbilirubinemia.    Assessment & Plan:   Principal Problem:   Abdominal pain Active Problems:   HTN (hypertension)   Hyperlipidemia   Hypokalemia   Nausea and vomiting   Transaminitis  1. Biliary duct dilatation, biliary colic, likely passed stone. Patient sp ERCP, sp sphincterectomy common bile duct with filling defect stone vs air bubble. No stones where obtained from the common bile duct while swept maneuver but positive sludge and pus. Negative cholangiogram. Patient has been placed on  ciprofloxacin IV with good toleration, improved leukocytosis (wbc 7,3). AST, ALT and T Bil trending down but not yet back to normal, (108, 135, 4,0). Will advance diet as tolerated. I spoke with surgery with recommendations for eventual cholecystectomy, that can be followed as outpatient. She prefers to follow up as outpatient.  Will check LFT in am.   2. HTN. Blood pressure has been stable with systolic 300 mmHg, will continue close monitoring.   3. Hypokalemia. Serum K at 3,5 with preserved renal function will advance diet. Hold on further IV fluids for now.   4. Dyslipidemia. On statin therapy.   5. Obesity. Calculated BMI is 37,9. Will need close follow up as outpatient.   DVT prophylaxis: enoxaparin   Code Status: full Family Communication: no family at the bedside  Disposition Plan/ discharge barriers: possible dc in am with oral antibiotic therapy.   Body mass index is 37.92 kg/m. Malnutrition Type:      Malnutrition Characteristics:      Nutrition Interventions:     RN Pressure Injury Documentation:     Consultants:   GI   Surgery over the phone   Procedures:     Antimicrobials:   IV ciprofloxacin     Subjective: Patient feeling better, improved appetite and abdominal pain, but not back to baseline, positive dyspepsia, no nausea or vomiting.   Objective: Vitals:   02/28/19 1410 02/28/19 1436 02/28/19 2128 03/01/19 0631  BP: 132/74 133/70 118/71 134/82  Pulse: 80 82 68 67  Resp: 11 17 16 16   Temp:  99.9 F (37.7 C) 98.6 F (37 C) 97.8 F (36.6 C)  TempSrc:  Oral Oral Oral  SpO2: 99% 94% 94% 98%  Weight:  Height:        Intake/Output Summary (Last 24 hours) at 03/01/2019 1129 Last data filed at 03/01/2019 1660 Gross per 24 hour  Intake 2329.27 ml  Output 3260 ml  Net -930.73 ml   [employer]   02/27/19 2033 02/28/19 1139  Weight: 97.1 kg 97.1 kg    Examination:   General: deconditioned  Neurology: Awake and alert, non  focal  E ENT: mild pallor, no icterus, oral mucosa moist Cardiovascular: No JVD. S1-S2 present, rhythmic, no gallops, rubs, or murmurs. No lower extremity edema. Pulmonary: positive breath sounds bilaterally, adequate air movement, no wheezing, rhonchi or rales. Gastrointestinal. Abdomen mild distended with no organomegaly, non tender, no rebound or guarding Skin. No rashes Musculoskeletal: no joint deformities     Data Reviewed: I have personally reviewed following labs and imaging studies  CBC: Recent Labs  Lab 02/27/19 0612 02/27/19 1507 02/28/19 0405 03/01/19 0352  WBC 10.4 13.0* 8.1 7.3  NEUTROABS  --  11.5*  --  6.0  HGB 13.4 12.0 11.8* 11.8*  HCT 42.4 37.1 38.5 36.8  MCV 86.5 87.5 90.4 88.7  PLT 296 250 249 630   Basic Metabolic Panel: Recent Labs  Lab 02/27/19 0612 02/27/19 1507 02/28/19 0405 03/01/19 0352  NA 139 139 138 141  K 2.9* 3.3* 3.4* 3.5  CL 101 106 105 108  CO2 26 26 24 26   GLUCOSE 152* 142* 134* 133*  BUN 16 17 12 10   CREATININE 1.11* 0.97 0.81 0.98  CALCIUM 9.1 8.3* 8.3* 8.5*   GFR: Estimated Creatinine Clearance: 67.7 mL/min (by C-G formula based on SCr of 0.98 mg/dL). Liver Function Tests: Recent Labs  Lab 02/27/19 0612 02/27/19 1507 02/28/19 0405 03/01/19 0352  AST 131* 236* 163* 108*  108*  ALT 99* 174* 162* 135*  137*  ALKPHOS 263* 250* 258* 286*  285*  BILITOT 2.1* 3.9* 4.5* 4.0*  4.1*  PROT 8.0 6.9 6.7 7.0  7.1  ALBUMIN 4.3 3.5 3.4* 3.5  3.5   Recent Labs  Lab 02/27/19 0612  LIPASE 35   No results for input(s): AMMONIA in the last 168 hours. Coagulation Profile: No results for input(s): INR, PROTIME in the last 168 hours. Cardiac Enzymes: No results for input(s): CKTOTAL, CKMB, CKMBINDEX, TROPONINI in the last 168 hours. BNP (last 3 results) No results for input(s): PROBNP in the last 8760 hours. HbA1C: Recent Labs    02/28/19 0405  HGBA1C 5.4   CBG: No results for input(s): GLUCAP in the last 168 hours.  Lipid Profile: No results for input(s): CHOL, HDL, LDLCALC, TRIG, CHOLHDL, LDLDIRECT in the last 72 hours. Thyroid Function Tests: No results for input(s): TSH, T4TOTAL, FREET4, T3FREE, THYROIDAB in the last 72 hours. Anemia Panel: No results for input(s): VITAMINB12, FOLATE, FERRITIN, TIBC, IRON, RETICCTPCT in the last 72 hours.    Radiology Studies: I have reviewed all of the imaging during this hospital visit personally     Scheduled Meds: . buPROPion  150 mg Oral QODAY  . cycloSPORINE  1 drop Both Eyes BID  . mupirocin ointment   Nasal BID  . pantoprazole  40 mg Oral Q0600  . sodium chloride flush  3 mL Intravenous Once  . traZODone  50 mg Oral Once   Continuous Infusions: . ciprofloxacin 400 mg (03/01/19 0908)  . dextrose 5 % and 0.2 % NaCl with KCl 20 mEq 75 mL/hr at 03/01/19 0904     LOS: 2 days         Quillian Quince  , MD \

## 2019-03-01 NOTE — Progress Notes (Signed)
Subjective: Abdominal pain resolved. Tolerating diet.  Objective: Vital signs in last 24 hours: Temp:  [97.8 F (36.6 C)-99.9 F (37.7 C)] 98.2 F (36.8 C) (06/06 1304) Pulse Rate:  [67-82] 75 (06/06 1304) Resp:  [16-17] 16 (06/06 1304) BP: (118-137)/(70-82) 137/81 (06/06 1304) SpO2:  [94 %-98 %] 98 % (06/06 1304) Weight change: 0.03 kg Last BM Date: 02/27/19  PE: GEN:  Mild jaundice, NAD ABD:  Soft, non-tender  Lab Results: CBC    Component Value Date/Time   WBC 7.3 03/01/2019 0352   RBC 4.15 03/01/2019 0352   HGB 11.8 (L) 03/01/2019 0352   HCT 36.8 03/01/2019 0352   PLT 258 03/01/2019 0352   MCV 88.7 03/01/2019 0352   MCH 28.4 03/01/2019 0352   MCHC 32.1 03/01/2019 0352   RDW 14.4 03/01/2019 0352   LYMPHSABS 0.8 03/01/2019 0352   MONOABS 0.4 03/01/2019 0352   EOSABS 0.0 03/01/2019 0352   BASOSABS 0.0 03/01/2019 0352   CMP     Component Value Date/Time   NA 141 03/01/2019 0352   K 3.5 03/01/2019 0352   CL 108 03/01/2019 0352   CO2 26 03/01/2019 0352   GLUCOSE 133 (H) 03/01/2019 0352   BUN 10 03/01/2019 0352   CREATININE 0.98 03/01/2019 0352   CALCIUM 8.5 (L) 03/01/2019 0352   PROT 7.1 03/01/2019 0352   PROT 7.0 03/01/2019 0352   ALBUMIN 3.5 03/01/2019 0352   ALBUMIN 3.5 03/01/2019 0352   AST 108 (H) 03/01/2019 0352   AST 108 (H) 03/01/2019 0352   ALT 137 (H) 03/01/2019 0352   ALT 135 (H) 03/01/2019 0352   ALKPHOS 285 (H) 03/01/2019 0352   ALKPHOS 286 (H) 03/01/2019 0352   BILITOT 4.1 (H) 03/01/2019 0352   BILITOT 4.0 (H) 03/01/2019 0352   GFRNONAA >60 03/01/2019 0352   GFRAA >60 03/01/2019 0352   Assessment:  1.  Bile duct stone/debris, with cholangitis.  Improving POD 1 biliary sphincterotomy and sludge/stone removal. 2.  Elevated liver enzymes, slow down-trend. 3.  Abdominal pain, resolved.  Plan:  1.  Will need 7-day total course of antibiotics (cipro/flagyl or augmentin are acceptable options) for cholangitis. 2.  Advance diet as  tolerated. 3.  Patient elects to pursue surgical consultation as outpatient. 4.  No further Gi work-up anticipated; ok to discharge home from GI perspective; Dr. Watt Climes can follow-up with liver enzymes and office follow-up as needed. 5.  Wendy Zimmerman will sign-off; please call with questions; thank you for the consultation.   Wendy Zimmerman 03/01/2019, 2:11 PM   Cell [phone] If no answer or after 5 PM call [phone]

## 2019-03-02 DIAGNOSIS — K8080 Other cholelithiasis without obstruction: Secondary | ICD-10-CM

## 2019-03-02 LAB — HEPATIC FUNCTION PANEL
ALT: 156 U/L — ABNORMAL HIGH (ref 0–44)
AST: 110 U/L — ABNORMAL HIGH (ref 15–41)
Albumin: 3.1 g/dL — ABNORMAL LOW (ref 3.5–5.0)
Alkaline Phosphatase: 302 U/L — ABNORMAL HIGH (ref 38–126)
Bilirubin, Direct: 1 mg/dL — ABNORMAL HIGH (ref 0.0–0.2)
Indirect Bilirubin: 1.1 mg/dL — ABNORMAL HIGH (ref 0.3–0.9)
Total Bilirubin: 2.1 mg/dL — ABNORMAL HIGH (ref 0.3–1.2)
Total Protein: 6.3 g/dL — ABNORMAL LOW (ref 6.5–8.1)

## 2019-03-02 MED ORDER — GABAPENTIN 300 MG PO CAPS
300.0000 mg | ORAL_CAPSULE | ORAL | Status: AC
  Administered 2019-03-03: 300 mg via ORAL
  Filled 2019-03-02: qty 1

## 2019-03-02 MED ORDER — HYDROCHLOROTHIAZIDE 12.5 MG PO CAPS
12.5000 mg | ORAL_CAPSULE | Freq: Every day | ORAL | Status: DC
  Administered 2019-03-02: 12.5 mg via ORAL
  Filled 2019-03-02: qty 1

## 2019-03-02 MED ORDER — IRBESARTAN 150 MG PO TABS: 300.0000 mg | ORAL_TABLET | Freq: Every day | ORAL | Status: DC

## 2019-03-02 MED ORDER — VALSARTAN 160 MG PO TABS
320.0000 mg | ORAL_TABLET | Freq: Every day | ORAL | Status: DC
  Administered 2019-03-02: 320 mg via ORAL
  Filled 2019-03-02 (×2): qty 2

## 2019-03-02 MED ORDER — KETOROLAC TROMETHAMINE 15 MG/ML IJ SOLN
15.0000 mg | INTRAMUSCULAR | Status: AC
  Administered 2019-03-03: 15 mg via INTRAVENOUS

## 2019-03-02 MED ORDER — AMOXICILLIN-POT CLAVULANATE 875-125 MG PO TABS
1.0000 | ORAL_TABLET | Freq: Two times a day (BID) | ORAL | Status: DC
  Administered 2019-03-02 (×2): 1 via ORAL
  Filled 2019-03-02 (×2): qty 1

## 2019-03-02 MED ORDER — TRAZODONE HCL 50 MG PO TABS
50.0000 mg | ORAL_TABLET | Freq: Once | ORAL | Status: AC
  Administered 2019-03-02: 50 mg via ORAL
  Filled 2019-03-02: qty 1

## 2019-03-02 MED ORDER — ACETAMINOPHEN 500 MG PO TABS
1000.0000 mg | ORAL_TABLET | ORAL | Status: AC
  Administered 2019-03-03: 1000 mg via ORAL
  Filled 2019-03-02: qty 2

## 2019-03-02 MED ORDER — DIPHENHYDRAMINE HCL 25 MG PO CAPS
25.0000 mg | ORAL_CAPSULE | Freq: Once | ORAL | Status: AC
  Administered 2019-03-02: 25 mg via ORAL
  Filled 2019-03-02: qty 1

## 2019-03-02 NOTE — Progress Notes (Signed)
Md Arrien paged about pt concern for need for bp meds. She feels like they need to be restarted.

## 2019-03-02 NOTE — Progress Notes (Addendum)
PROGRESS NOTE    Wendy Zimmerman  VBT:[id number] DOB: [birth year] DOA: 02/27/2019 PCP: Kelton Pillar, MD    Brief Narrative:  58 year old female who presented with severe abdominal pain. She does have significant past medical history for hypertension, dyslipidemia, and Bowen'sdisease. Reported7 days of severe abdominal pain, associated with nausea,vomiting, and low appetite. Pain localized in the epigastric, periumbilical, right upper quadrant. On her initial physical examination her temperature was 98.7, heart rate 113, blood pressure 143/77, her lungs were clear to auscultation bilaterally, heart S1-S2 present and rhythmic,her abdomen was soft nontender, no lower extremity edema. Sodium 139, potassium 3.3, chloride 106, bicarb 26, glucose 142, BUN 17, creatinine 1.97, AST 236, ALT 174, total bilirubins 3.9, white count 13.0, hemoglobin 12.0, hematocrit 37.1, platelets 250.SARS COVID-19 was negative.Urinalysis with specific gravity >1.046.CT of the abdomen with mild intra-and extrahepatic bile duct dilatation, sigmoid diverticulosis,fatty right inguinal hernia. Right upper quadrant ultrasound with no acute changes. EKG 108 bpm, normal axis, normal intervals, sinus rhythm with normal conduction, no ST segment or T wave changes.  Patient was admitted to the hospital with a working diagnosis of abdominal pain likely related tobilairydilatation/ biliary colic, complicated with elevated transaminases and hyperbilirubinemia  Assessment & Plan:   Principal Problem:   Abdominal pain Active Problems:   HTN (hypertension)   Hyperlipidemia   Hypokalemia   Nausea and vomiting   Transaminitis  1. Biliary duct dilatation, biliary colic, likely passed stone. sp ERCP, sp sphincterectomy. Patient tolerating po well, no nausea or vomiting, no further abdominal pain. LFT with trending down T Bil, today at 2,1 from 4,0, AST and ALT persistently elevated at 110 and 156, ALK 302. Patient has decided  to proceed with surgical intervention on this admission, and the surgical team has been informed. Will transition to oral antibiotic therapy with Augmentin.   2. HTN. Will resume antihypertensive agents, with valsartan and HCTZ. Continue blood pressure monitoring.   3. Hypokalemia. Continue to hod on IV fluids, will check bmp in am.  4. Dyslipidemia. On statin therapy.   5. Obesity. Calculated BMI is 37,9.  6, Mild allergic reaction. Bilateral upper extremities, no specific trigger, will order benadryl and continue close monitoring.   DVT prophylaxis:enoxaparin Code Status:full Family Communication:no family at the bedside Disposition Plan/ discharge barriers:pending surgical intervention.   Body mass index is 37.92 kg/m. Malnutrition Type:      Malnutrition Characteristics:      Nutrition Interventions:     RN Pressure Injury Documentation:     Consultants:   Surgery   GI   Procedures:   ERCP  Antimicrobials:   Augmentin     Subjective: Patient had a new rash bilateral lateral upper extremities, no further abdominal pain, no nausea or vomiting, tolerating po well.   Objective: Vitals:   03/01/19 0631 03/01/19 1304 03/01/19 2143 03/02/19 0647  BP: 134/82 137/81 (!) 146/85 (!) 149/79  Pulse: 67 75 84 73  Resp: _0 Temp: 97.8 F (36.6 C) 98.2 F (36.8 C) 99.6 F (37.6 C) 98.4 F (36.9 C)  TempSrc: Oral Oral Oral Oral  SpO2: 98% 98% 95% 97%  Weight:      Height:        Intake/Output Summary (Last 24 hours) at 03/02/2019 1241 Last data filed at 03/02/2019 1223 Gross per 24 hour  Intake 2076.28 ml  Output 2250 ml  Net -173.72 ml   Filed Weights   02/27/19 2033 02/28/19 1139  Weight: 97.1 kg 97.1 kg    Examination:  General: Not in pain or dyspnea.  Neurology: Awake and alert, non focal  E ENT: no pallor, no icterus, oral mucosa moist Cardiovascular: No JVD. S1-S2 present, rhythmic, no gallops, rubs, or murmurs. No  lower extremity edema. Pulmonary: positive breath sounds bilaterally, adequate air movement, no wheezing, rhonchi or rales. Gastrointestinal. Abdomen protuberant with no organomegaly, non tender, no rebound or guarding Skin. Faint macular rash bilateral upper extremities, ill defined margins, no hives.  Musculoskeletal: no joint deformities     Data Reviewed: I have personally reviewed following labs and imaging studies  CBC: Recent Labs  Lab 02/27/19 0612 02/27/19 1507 02/28/19 0405 03/01/19 0352  WBC 10.4 13.0* 8.1 7.3  NEUTROABS  --  11.5*  --  6.0  HGB 13.4 12.0 11.8* 11.8*  HCT 42.4 37.1 38.5 36.8  MCV 86.5 87.5 90.4 88.7  PLT 296 250 249 101   Basic Metabolic Panel: Recent Labs  Lab 02/27/19 0612 02/27/19 1507 02/28/19 0405 03/01/19 0352  NA 139 139 138 141  K 2.9* 3.3* 3.4* 3.5  CL 101 106 105 108  CO2 _0 GLUCOSE 152* 142* 134* 133*  BUN _1 CREATININE 1.11* 0.97 0.81 0.98  CALCIUM 9.1 8.3* 8.3* 8.5*   GFR: Estimated Creatinine Clearance: 67.7 mL/min (by C-G formula based on SCr of 0.98 mg/dL). Liver Function Tests: Recent Labs  Lab 02/27/19 0612 02/27/19 1507 02/28/19 0405 03/01/19 0352 03/02/19 0339  AST 131* 236* 163* 108*  108* 110*  ALT 99* 174* 162* 135*  137* 156*  ALKPHOS 263* 250* 258* 286*  285* 302*  BILITOT 2.1* 3.9* 4.5* 4.0*  4.1* 2.1*  PROT 8.0 6.9 6.7 7.0  7.1 6.3*  ALBUMIN 4.3 3.5 3.4* 3.5  3.5 3.1*   Recent Labs  Lab 02/27/19 0612  LIPASE 35   No results for input(s): AMMONIA in the last 168 hours. Coagulation Profile: No results for input(s): INR, PROTIME in the last 168 hours. Cardiac Enzymes: No results for input(s): CKTOTAL, CKMB, CKMBINDEX, TROPONINI in the last 168 hours. BNP (last 3 results) No results for input(s): PROBNP in the last 8760 hours. HbA1C: Recent Labs    02/28/19 0405  HGBA1C 5.4   CBG: No results for input(s): GLUCAP in the last 168 hours. Lipid Profile: No results  for input(s): CHOL, HDL, LDLCALC, TRIG, CHOLHDL, LDLDIRECT in the last 72 hours. Thyroid Function Tests: No results for input(s): TSH, T4TOTAL, FREET4, T3FREE, THYROIDAB in the last 72 hours. Anemia Panel: No results for input(s): VITAMINB12, FOLATE, FERRITIN, TIBC, IRON, RETICCTPCT in the last 72 hours.    Radiology Studies: I have reviewed all of the imaging during this hospital visit personally     Scheduled Meds: . amoxicillin-clavulanate  1 tablet Oral Q12H  . buPROPion  150 mg Oral QODAY  . cycloSPORINE  1 drop Both Eyes BID  . hydrochlorothiazide  12.5 mg Oral Daily  . mupirocin ointment   Nasal BID  . pantoprazole  40 mg Oral Q0600  . valsartan  320 mg Oral Daily   Continuous Infusions:   LOS: 3 days         Gerome Apley, MD

## 2019-03-02 NOTE — Consult Note (Signed)
Reason for Consult: Choledocholithiasis Referring Physician: Dr. Cathlean Sauer  Wendy Zimmerman is an 58 y.o. female.  HPI: Patient is a 58 year old female with a history of hypertension, hyperlipidemia, who came in with a one-week history of abdominal pain.  Patient states that she began with epigastric abdominal pain on memorial day.  She states that subsequent to this she had several days of continued abdominal pain that radiates to the right upper quadrant as well as to her back.  Patient states that he had associated nausea and vomiting.  She states that the rest of the week she did well up until this admission.  She states that the pain continued the epigastrium.  Patient was brought herself to the ER secondary to the continued pain  Upon evaluation the ER patient underwent ultrasound and CT scan which was consistent with cholelithiasis and choledocholithiasis.  Patient was had an elevated T bili during this time.  GI was consulted and patient underwent ERCP.  ERCP revealed sludge within the common bile duct.  General surgery was then consulted for further evaluation and management of her cholelithiasis.  She has had a previous robotic hysterectomy.   Past Medical History:  Diagnosis Date  . Bowen disease   . HTN (hypertension)   . Hyperlipidemia   . Osteoarthritis     Past Surgical History:  Procedure Laterality Date  . CATARACT EXTRACTION    . ERCP N/A 02/28/2019   Procedure: ENDOSCOPIC RETROGRADE CHOLANGIOPANCREATOGRAPHY (ERCP);  Surgeon: Clarene Essex, MD;  Location: [facility];  Service: Endoscopy;  Laterality: N/A;  . HAND SURGERY    . SPHINCTEROTOMY  02/28/2019   Procedure: SPHINCTEROTOMY;  Surgeon: Clarene Essex, MD;  Location: [facility];  Service: Endoscopy;;  balloon sweep    Family History  Problem Relation Age of Onset  . Cancer Other   . Hypertension Other   . CAD Other     Social History:  reports that she has never smoked. She has never used smokeless tobacco. She reports  that she does not drink alcohol or use drugs.  Allergies:  Allergies  Allergen Reactions  . Lisinopril Swelling and Other (See Comments)    Face and tongue swelling    Medications: I have reviewed the patient's current medications.  Results for orders placed or performed during the hospital encounter of 02/27/19 (from the past 48 hour(s))  Comprehensive metabolic panel     Status: Abnormal   Collection Time: 03/01/19  3:52 AM  Result Value Ref Range   Sodium 141 135 - 145 mmol/L   Potassium 3.5 3.5 - 5.1 mmol/L   Chloride 108 98 - 111 mmol/L   CO2 26 22 - 32 mmol/L   Glucose, Bld 133 (H) 70 - 99 mg/dL   BUN 10 6 - 20 mg/dL   Creatinine, Ser 0.98 0.44 - 1.00 mg/dL   Calcium 8.5 (L) 8.9 - 10.3 mg/dL   Total Protein 7.1 6.5 - 8.1 g/dL   Albumin 3.5 3.5 - 5.0 g/dL   AST 108 (H) 15 - 41 U/L   ALT 137 (H) 0 - 44 U/L   Alkaline Phosphatase 285 (H) 38 - 126 U/L   Total Bilirubin 4.1 (H) 0.3 - 1.2 mg/dL   GFR calc non Af Amer >60 >60 mL/min   GFR calc Af Amer >60 >60 mL/min   Anion gap 7 5 - 15    Comment: Performed at [facility], [place] [address], [place], [place] [address]  CBC with Differential/Platelet  Status: Abnormal   Collection Time: 03/01/19  3:52 AM  Result Value Ref Range   WBC 7.3 4.0 - 10.5 K/uL   RBC 4.15 3.87 - 5.11 MIL/uL   Hemoglobin 11.8 (L) 12.0 - 15.0 g/dL   HCT 36.8 36.0 - 46.0 %   MCV 88.7 80.0 - 100.0 fL   MCH 28.4 26.0 - 34.0 pg   MCHC 32.1 30.0 - 36.0 g/dL   RDW 14.4 11.5 - 15.5 %   Platelets 258 150 - 400 K/uL   nRBC 0.0 0.0 - 0.2 %   Neutrophils Relative % 82 %   Neutro Abs 6.0 1.7 - 7.7 K/uL   Lymphocytes Relative 11 %   Lymphs Abs 0.8 0.7 - 4.0 K/uL   Monocytes Relative 6 %   Monocytes Absolute 0.4 0.1 - 1.0 K/uL   Eosinophils Relative 0 %   Eosinophils Absolute 0.0 0.0 - 0.5 K/uL   Basophils Relative 0 %   Basophils Absolute 0.0 0.0 - 0.1 K/uL   Immature Granulocytes 1 %   Abs Immature Granulocytes 0.07 0.00 - 0.07  K/uL    Comment: Performed at [facility], [place] [address], [place], [place] [address]  Hepatic function panel     Status: Abnormal   Collection Time: 03/01/19  3:52 AM  Result Value Ref Range   Total Protein 7.0 6.5 - 8.1 g/dL   Albumin 3.5 3.5 - 5.0 g/dL   AST 108 (H) 15 - 41 U/L   ALT 135 (H) 0 - 44 U/L   Alkaline Phosphatase 286 (H) 38 - 126 U/L   Total Bilirubin 4.0 (H) 0.3 - 1.2 mg/dL   Bilirubin, Direct 2.8 (H) 0.0 - 0.2 mg/dL   Indirect Bilirubin 1.2 (H) 0.3 - 0.9 mg/dL    Comment: Performed at [facility], [place] [address], [place], [place] [address]  Hepatic function panel     Status: Abnormal   Collection Time: 03/02/19  3:39 AM  Result Value Ref Range   Total Protein 6.3 (L) 6.5 - 8.1 g/dL   Albumin 3.1 (L) 3.5 - 5.0 g/dL   AST 110 (H) 15 - 41 U/L   ALT 156 (H) 0 - 44 U/L   Alkaline Phosphatase 302 (H) 38 - 126 U/L   Total Bilirubin 2.1 (H) 0.3 - 1.2 mg/dL   Bilirubin, Direct 1.0 (H) 0.0 - 0.2 mg/dL   Indirect Bilirubin 1.1 (H) 0.3 - 0.9 mg/dL    Comment: Performed at [facility], [place] [address], [place], [place] [address]    Dg Ercp Biliary & Pancreatic Ducts  Result Date: 02/28/2019 CLINICAL DATA:  Sphincterotomy for possible CBD stones. EXAM: ERCP TECHNIQUE: Multiple spot images obtained with the fluoroscopic device and submitted for interpretation post-procedure. COMPARISON:  CT abdomen pelvis-02/27/2019 FLUOROSCOPY TIME:  3 minutes, 34 seconds FINDINGS: Seven spot fluoroscopic images of the right upper abdominal quadrant during ERCP are provided for review Initial image demonstrates an ERCP probe overlying the right upper abdominal quadrant Subsequent images demonstrate selective cannulation opacification of the CBD which appears mildly dilated. Subsequent images demonstrate insufflation of a balloon within the mid/distal aspect of the CBD with subsequent biliary sweeping and presumed sphincterotomy. Completion  image demonstrates a presumed air bubble within the distal aspect of the CBD. IMPRESSION: ERCP with biliary sweeping and presumed sphincterotomy. These images were submitted for radiologic interpretation only. Please see the procedural report for the amount of contrast and the fluoroscopy time utilized. Electronically Signed  By: Sandi Mariscal M.D.   On: 02/28/2019 14:39    Review of Systems  Constitutional: Negative for chills, fever and malaise/fatigue.  HENT: Negative for ear discharge, hearing loss and sore throat.   Eyes: Negative for blurred vision and discharge.  Respiratory: Negative for cough and shortness of breath.   Cardiovascular: Negative for chest pain, orthopnea and leg swelling.  Gastrointestinal: Negative for abdominal pain, constipation, diarrhea, heartburn, nausea and vomiting.  Musculoskeletal: Negative for myalgias and neck pain.  Skin: Negative for itching and rash.  Neurological: Negative for dizziness, focal weakness, seizures and loss of consciousness.  Endo/Heme/Allergies: Negative for environmental allergies. Does not bruise/bleed easily.  Psychiatric/Behavioral: Negative for depression and suicidal ideas.  All other systems reviewed and are negative.  Blood pressure (!) 149/79, pulse 73, temperature 98.4 F (36.9 C), temperature source Oral, resp. rate 16, height 5\' 3"  (1.6 m), weight 97.1 kg, SpO2 97 %. Physical Exam  Constitutional: She is oriented to person, place, and time. Vital signs are normal. She appears well-developed and well-nourished.  Conversant No acute distress  Eyes: Lids are normal. No scleral icterus.  No lid lag Moist conjunctiva  Neck: No tracheal tenderness present. No thyromegaly present.  No cervical lymphadenopathy  Cardiovascular: Normal rate, regular rhythm and intact distal pulses.  No murmur heard. Respiratory: Effort normal and breath sounds normal. She has no wheezes. She has no rales.  GI: There is no hepatosplenomegaly. There  is no abdominal tenderness. No hernia.  Neurological: She is alert and oriented to person, place, and time.  Normal gait and station  Skin: Skin is warm. No rash noted. No cyanosis. Nails show no clubbing.  Normal skin turgor  Psychiatric: Judgment normal.  Appropriate affect    Assessment/Plan: 58 year old female with history of hypertension, hyperlipidemia, choledocholithiasis, cholelithiasis  1.  The patient would like to proceed to the operating for laparoscopic cholecystectomy.  We will make her n.p.o. after midnight and discuss surgery with Dr. gross will get this set up for tomorrow.   Ralene Ok 03/02/2019, 11:38 AM

## 2019-03-02 NOTE — Progress Notes (Signed)
Md paged about pt redness on arms bilaterally. Pt with redness and warmness on both arms. This is isolated to arms. No changes to note overall.

## 2019-03-03 ENCOUNTER — Encounter (HOSPITAL_COMMUNITY): Payer: Self-pay | Admitting: Emergency Medicine

## 2019-03-03 ENCOUNTER — Inpatient Hospital Stay (HOSPITAL_COMMUNITY): Payer: 59 | Admitting: Certified Registered"

## 2019-03-03 ENCOUNTER — Encounter (HOSPITAL_COMMUNITY)
Admission: EM | Disposition: A | Discharge status: Home / Self Care | Payer: Self-pay | Source: Home / Self Care | Attending: Internal Medicine

## 2019-03-03 ENCOUNTER — Inpatient Hospital Stay (HOSPITAL_COMMUNITY): Payer: 59

## 2019-03-03 DIAGNOSIS — K8063 Calculus of gallbladder and bile duct with acute cholecystitis with obstruction: Secondary | ICD-10-CM

## 2019-03-03 DIAGNOSIS — K810 Acute cholecystitis: Secondary | ICD-10-CM

## 2019-03-03 DIAGNOSIS — K819 Cholecystitis, unspecified: Secondary | ICD-10-CM

## 2019-03-03 HISTORY — PX: LAPAROSCOPIC CHOLECYSTECTOMY SINGLE PORT: SHX5891

## 2019-03-03 LAB — CBC WITH DIFFERENTIAL/PLATELET
Abs Immature Granulocytes: 0.19 10*3/uL — ABNORMAL HIGH (ref 0.00–0.07)
Basophils Absolute: 0.1 10*3/uL (ref 0.0–0.1)
Basophils Relative: 1 %
Eosinophils Absolute: 0.2 10*3/uL (ref 0.0–0.5)
Eosinophils Relative: 4 %
HCT: 38.7 % (ref 36.0–46.0)
Hemoglobin: 12 g/dL (ref 12.0–15.0)
Immature Granulocytes: 3 %
Lymphocytes Relative: 30 %
Lymphs Abs: 1.8 10*3/uL (ref 0.7–4.0)
MCH: 27.5 pg (ref 26.0–34.0)
MCHC: 31 g/dL (ref 30.0–36.0)
MCV: 88.6 fL (ref 80.0–100.0)
Monocytes Absolute: 0.5 10*3/uL (ref 0.1–1.0)
Monocytes Relative: 8 %
Neutro Abs: 3.2 10*3/uL (ref 1.7–7.7)
Neutrophils Relative %: 54 %
Platelets: 296 10*3/uL (ref 150–400)
RBC: 4.37 MIL/uL (ref 3.87–5.11)
RDW: 14.6 % (ref 11.5–15.5)
WBC: 6.1 10*3/uL (ref 4.0–10.5)
nRBC: 0 % (ref 0.0–0.2)

## 2019-03-03 LAB — HEPATIC FUNCTION PANEL
ALT: 125 U/L — ABNORMAL HIGH (ref 0–44)
AST: 59 U/L — ABNORMAL HIGH (ref 15–41)
Albumin: 3.3 g/dL — ABNORMAL LOW (ref 3.5–5.0)
Alkaline Phosphatase: 318 U/L — ABNORMAL HIGH (ref 38–126)
Bilirubin, Direct: 0.6 mg/dL — ABNORMAL HIGH (ref 0.0–0.2)
Indirect Bilirubin: 0.6 mg/dL (ref 0.3–0.9)
Total Bilirubin: 1.2 mg/dL (ref 0.3–1.2)
Total Protein: 6.6 g/dL (ref 6.5–8.1)

## 2019-03-03 LAB — BASIC METABOLIC PANEL
Anion gap: 7 (ref 5–15)
BUN: 14 mg/dL (ref 6–20)
CO2: 29 mmol/L (ref 22–32)
Calcium: 8.7 mg/dL — ABNORMAL LOW (ref 8.9–10.3)
Chloride: 105 mmol/L (ref 98–111)
Creatinine, Ser: 0.98 mg/dL (ref 0.44–1.00)
GFR calc Af Amer: >60 mL/min (ref >60)
GFR calc non Af Amer: >60 mL/min (ref >60)
Glucose, Bld: 109 mg/dL — ABNORMAL HIGH (ref 70–99)
Potassium: 3.9 mmol/L (ref 3.5–5.1)
Sodium: 141 mmol/L (ref 135–145)

## 2019-03-03 SURGERY — LAPAROSCOPIC CHOLECYSTECTOMY SINGLE SITE
Anesthesia: General

## 2019-03-03 MED ORDER — DEXAMETHASONE SODIUM PHOSPHATE 10 MG/ML IJ SOLN
INTRAMUSCULAR | Status: DC | PRN
  Administered 2019-03-03: 4 mg via INTRAVENOUS

## 2019-03-03 MED ORDER — IBUPROFEN 200 MG PO TABS: ORAL_TABLET | ORAL | Status: DC

## 2019-03-03 MED ORDER — SODIUM CHLORIDE 0.9 % IV SOLN: 250.0000 mL | INTRAVENOUS | Status: DC | PRN

## 2019-03-03 MED ORDER — SUGAMMADEX SODIUM 200 MG/2ML IV SOLN
INTRAVENOUS | Status: AC
  Filled 2019-03-03: qty 2

## 2019-03-03 MED ORDER — SUGAMMADEX SODIUM 200 MG/2ML IV SOLN
INTRAVENOUS | Status: DC | PRN
  Administered 2019-03-03: 200 mg via INTRAVENOUS

## 2019-03-03 MED ORDER — FENTANYL CITRATE (PF) 100 MCG/2ML IJ SOLN
INTRAMUSCULAR | Status: AC
  Filled 2019-03-03: qty 2

## 2019-03-03 MED ORDER — SUCCINYLCHOLINE CHLORIDE 200 MG/10ML IV SOSY
PREFILLED_SYRINGE | INTRAVENOUS | Status: DC | PRN
  Administered 2019-03-03: 100 mg via INTRAVENOUS

## 2019-03-03 MED ORDER — IBUPROFEN 400 MG PO TABS: 600.0000 mg | ORAL_TABLET | Freq: Four times a day (QID) | ORAL | Status: DC | PRN

## 2019-03-03 MED ORDER — PROPOFOL 10 MG/ML IV BOLUS
INTRAVENOUS | Status: AC
  Filled 2019-03-03: qty 20

## 2019-03-03 MED ORDER — SODIUM CHLORIDE 0.9 % IV SOLN
8.0000 mg | Freq: Four times a day (QID) | INTRAVENOUS | Status: DC | PRN
  Filled 2019-03-03: qty 4

## 2019-03-03 MED ORDER — ACETAMINOPHEN 10 MG/ML IV SOLN: 1000.0000 mg | Freq: Once | INTRAVENOUS | Status: DC | PRN

## 2019-03-03 MED ORDER — BUPIVACAINE LIPOSOME 1.3 % IJ SUSP
20.0000 mL | Freq: Once | INTRAMUSCULAR | Status: DC
  Filled 2019-03-03: qty 20

## 2019-03-03 MED ORDER — CIPROFLOXACIN HCL 500 MG PO TABS: 500.0000 mg | ORAL_TABLET | Freq: Two times a day (BID) | ORAL | 1 refills | Status: DC

## 2019-03-03 MED ORDER — SODIUM CHLORIDE 0.9% FLUSH: 3.0000 mL | Freq: Two times a day (BID) | INTRAVENOUS | Status: DC

## 2019-03-03 MED ORDER — LACTATED RINGERS IV SOLN
INTRAVENOUS | Status: DC
  Administered 2019-03-03: 09:00:00 via INTRAVENOUS

## 2019-03-03 MED ORDER — TRAMADOL HCL 50 MG PO TABS: 50.0000 mg | ORAL_TABLET | Freq: Four times a day (QID) | ORAL | Status: DC | PRN

## 2019-03-03 MED ORDER — ASPIRIN EC 81 MG PO TBEC: 81.0000 mg | DELAYED_RELEASE_TABLET | Freq: Every day | ORAL | Status: DC

## 2019-03-03 MED ORDER — DEXAMETHASONE SODIUM PHOSPHATE 10 MG/ML IJ SOLN
INTRAMUSCULAR | Status: AC
  Filled 2019-03-03: qty 1

## 2019-03-03 MED ORDER — SODIUM CHLORIDE 0.9 % IV SOLN
2.0000 g | INTRAVENOUS | Status: AC
  Administered 2019-03-03: 2 g via INTRAVENOUS
  Filled 2019-03-03: qty 20
  Filled 2019-03-03: qty 2

## 2019-03-03 MED ORDER — METRONIDAZOLE IN NACL 5-0.79 MG/ML-% IV SOLN
500.0000 mg | Freq: Three times a day (TID) | INTRAVENOUS | Status: DC
  Administered 2019-03-03: 500 mg via INTRAVENOUS
  Filled 2019-03-03: qty 100

## 2019-03-03 MED ORDER — AMOXICILLIN-POT CLAVULANATE 875-125 MG PO TABS: 1.0000 | ORAL_TABLET | Freq: Two times a day (BID) | ORAL | 0 refills | Status: AC

## 2019-03-03 MED ORDER — FENTANYL CITRATE (PF) 100 MCG/2ML IJ SOLN: 25.0000 ug | INTRAMUSCULAR | Status: DC | PRN

## 2019-03-03 MED ORDER — PHENYLEPHRINE 40 MCG/ML (10ML) SYRINGE FOR IV PUSH (FOR BLOOD PRESSURE SUPPORT)
PREFILLED_SYRINGE | INTRAVENOUS | Status: AC
  Filled 2019-03-03: qty 10

## 2019-03-03 MED ORDER — ADULT MULTIVITAMIN W/MINERALS CH: 1.0000 | ORAL_TABLET | Freq: Every day | ORAL | Status: DC

## 2019-03-03 MED ORDER — PSYLLIUM 95 % PO PACK
1.0000 | PACK | Freq: Every day | ORAL | Status: DC
  Filled 2019-03-03: qty 1

## 2019-03-03 MED ORDER — EPHEDRINE SULFATE-NACL 50-0.9 MG/10ML-% IV SOSY
PREFILLED_SYRINGE | INTRAVENOUS | Status: DC | PRN
  Administered 2019-03-03: 10 mg via INTRAVENOUS
  Administered 2019-03-03: 15 mg via INTRAVENOUS
  Administered 2019-03-03: 10 mg via INTRAVENOUS

## 2019-03-03 MED ORDER — PROMETHAZINE HCL 25 MG/ML IJ SOLN
6.2500 mg | INTRAMUSCULAR | Status: DC | PRN
  Administered 2019-03-03: 6.25 mg via INTRAVENOUS

## 2019-03-03 MED ORDER — TRAMADOL HCL 50 MG PO TABS: 50.0000 mg | ORAL_TABLET | Freq: Four times a day (QID) | ORAL | 0 refills | Status: DC | PRN

## 2019-03-03 MED ORDER — BUPIVACAINE LIPOSOME 1.3 % IJ SUSP
INTRAMUSCULAR | Status: DC | PRN
  Administered 2019-03-03: 20 mL

## 2019-03-03 MED ORDER — LIDOCAINE 2% (20 MG/ML) 5 ML SYRINGE
INTRAMUSCULAR | Status: DC | PRN
  Administered 2019-03-03: 60 mg via INTRAVENOUS

## 2019-03-03 MED ORDER — ACETAMINOPHEN 500 MG PO TABS
1000.0000 mg | ORAL_TABLET | Freq: Three times a day (TID) | ORAL | Status: DC
  Administered 2019-03-03: 1000 mg via ORAL
  Filled 2019-03-03: qty 2

## 2019-03-03 MED ORDER — FENTANYL CITRATE (PF) 250 MCG/5ML IJ SOLN
INTRAMUSCULAR | Status: DC | PRN
  Administered 2019-03-03: 100 ug via INTRAVENOUS
  Administered 2019-03-03 (×2): 50 ug via INTRAVENOUS

## 2019-03-03 MED ORDER — BUPIVACAINE-EPINEPHRINE (PF) 0.25% -1:200000 IJ SOLN
INTRAMUSCULAR | Status: AC
  Filled 2019-03-03: qty 30

## 2019-03-03 MED ORDER — CHLORHEXIDINE GLUCONATE CLOTH 2 % EX PADS: 6.0000 | MEDICATED_PAD | Freq: Once | CUTANEOUS | Status: DC

## 2019-03-03 MED ORDER — ONDANSETRON HCL 4 MG/2ML IJ SOLN
INTRAMUSCULAR | Status: DC | PRN
  Administered 2019-03-03: 4 mg via INTRAVENOUS

## 2019-03-03 MED ORDER — LACTATED RINGERS IV BOLUS: 1000.0000 mL | Freq: Three times a day (TID) | INTRAVENOUS | Status: DC | PRN

## 2019-03-03 MED ORDER — ONDANSETRON HCL 4 MG/2ML IJ SOLN
INTRAMUSCULAR | Status: AC
  Filled 2019-03-03: qty 2

## 2019-03-03 MED ORDER — ROCURONIUM BROMIDE 10 MG/ML (PF) SYRINGE
PREFILLED_SYRINGE | INTRAVENOUS | Status: AC
  Filled 2019-03-03: qty 10

## 2019-03-03 MED ORDER — EPHEDRINE 5 MG/ML INJ
INTRAVENOUS | Status: AC
  Filled 2019-03-03: qty 10

## 2019-03-03 MED ORDER — ONDANSETRON HCL 4 MG/2ML IJ SOLN: 4.0000 mg | Freq: Four times a day (QID) | INTRAMUSCULAR | Status: DC | PRN

## 2019-03-03 MED ORDER — LIDOCAINE 2% (20 MG/ML) 5 ML SYRINGE
INTRAMUSCULAR | Status: AC
  Filled 2019-03-03: qty 5

## 2019-03-03 MED ORDER — BUPIVACAINE-EPINEPHRINE 0.25% -1:200000 IJ SOLN
INTRAMUSCULAR | Status: DC | PRN
  Administered 2019-03-03: 30 mL

## 2019-03-03 MED ORDER — ATORVASTATIN CALCIUM 20 MG PO TABS: 20.0000 mg | ORAL_TABLET | Freq: Every day | ORAL | Status: DC

## 2019-03-03 MED ORDER — PROMETHAZINE HCL 25 MG/ML IJ SOLN
INTRAMUSCULAR | Status: AC
  Administered 2019-03-03: 6.25 mg via INTRAVENOUS
  Filled 2019-03-03: qty 1

## 2019-03-03 MED ORDER — PROCHLORPERAZINE EDISYLATE 10 MG/2ML IJ SOLN: 5.0000 mg | INTRAMUSCULAR | Status: DC | PRN

## 2019-03-03 MED ORDER — MAGIC MOUTHWASH
15.0000 mL | Freq: Four times a day (QID) | ORAL | Status: DC | PRN
  Filled 2019-03-03: qty 15

## 2019-03-03 MED ORDER — GABAPENTIN 300 MG PO CAPS
300.0000 mg | ORAL_CAPSULE | Freq: Two times a day (BID) | ORAL | Status: DC
  Administered 2019-03-03: 300 mg via ORAL
  Filled 2019-03-03: qty 1

## 2019-03-03 MED ORDER — GABAPENTIN 300 MG PO CAPS: 300.0000 mg | ORAL_CAPSULE | Freq: Two times a day (BID) | ORAL | Status: DC

## 2019-03-03 MED ORDER — OXYCODONE HCL 5 MG/5ML PO SOLN: 5.0000 mg | Freq: Once | ORAL | Status: DC | PRN

## 2019-03-03 MED ORDER — ROCURONIUM BROMIDE 10 MG/ML (PF) SYRINGE
PREFILLED_SYRINGE | INTRAVENOUS | Status: DC | PRN
  Administered 2019-03-03: 10 mg via INTRAVENOUS
  Administered 2019-03-03: 40 mg via INTRAVENOUS

## 2019-03-03 MED ORDER — METRONIDAZOLE 500 MG PO TABS: 500.0000 mg | ORAL_TABLET | Freq: Three times a day (TID) | ORAL | 1 refills | Status: DC

## 2019-03-03 MED ORDER — LIP MEDEX EX OINT: 1.0000 "application " | TOPICAL_OINTMENT | Freq: Two times a day (BID) | CUTANEOUS | Status: DC

## 2019-03-03 MED ORDER — SODIUM CHLORIDE 0.9% FLUSH: 3.0000 mL | INTRAVENOUS | Status: DC | PRN

## 2019-03-03 MED ORDER — STERILE WATER FOR IRRIGATION IR SOLN
Status: DC | PRN
  Administered 2019-03-03: 1000 mL

## 2019-03-03 MED ORDER — OXYCODONE HCL 5 MG PO TABS: 5.0000 mg | ORAL_TABLET | Freq: Once | ORAL | Status: DC | PRN

## 2019-03-03 MED ORDER — AMOXICILLIN-POT CLAVULANATE 875-125 MG PO TABS
1.0000 | ORAL_TABLET | Freq: Two times a day (BID) | ORAL | Status: DC
  Administered 2019-03-03: 19:00:00 1 via ORAL
  Filled 2019-03-03: qty 1

## 2019-03-03 MED ORDER — ACETAMINOPHEN 500 MG PO TABS: 1000.0000 mg | ORAL_TABLET | Freq: Three times a day (TID) | ORAL | Status: DC

## 2019-03-03 MED ORDER — PROPOFOL 10 MG/ML IV BOLUS
INTRAVENOUS | Status: DC | PRN
  Administered 2019-03-03: 160 mg via INTRAVENOUS

## 2019-03-03 MED ORDER — 0.9 % SODIUM CHLORIDE (POUR BTL) OPTIME
TOPICAL | Status: DC | PRN
  Administered 2019-03-03: 1000 mL

## 2019-03-03 MED ORDER — LACTATED RINGERS IV SOLN: INTRAVENOUS | Status: DC

## 2019-03-03 MED ORDER — PHENYLEPHRINE 40 MCG/ML (10ML) SYRINGE FOR IV PUSH (FOR BLOOD PRESSURE SUPPORT)
PREFILLED_SYRINGE | INTRAVENOUS | Status: DC | PRN
  Administered 2019-03-03 (×2): 40 ug via INTRAVENOUS
  Administered 2019-03-03 (×2): 80 ug via INTRAVENOUS

## 2019-03-03 MED ORDER — LACTATED RINGERS IV SOLN
INTRAVENOUS | Status: AC | PRN
  Administered 2019-03-03: 1000 mL via INTRAVENOUS

## 2019-03-03 MED ORDER — MIDAZOLAM HCL 2 MG/2ML IJ SOLN
INTRAMUSCULAR | Status: AC
  Filled 2019-03-03: qty 2

## 2019-03-03 MED ORDER — MIDAZOLAM HCL 2 MG/2ML IJ SOLN
INTRAMUSCULAR | Status: DC | PRN
  Administered 2019-03-03: 2 mg via INTRAVENOUS

## 2019-03-03 SURGICAL SUPPLY — 44 items
APPLIER CLIP 5 13 M/L LIGAMAX5 (MISCELLANEOUS) ×2
CABLE HIGH FREQUENCY MONO STRZ (ELECTRODE) ×2 IMPLANT
CHLORAPREP W/TINT 26 (MISCELLANEOUS) ×2 IMPLANT
CLIP APPLIE 5 13 M/L LIGAMAX5 (MISCELLANEOUS) ×1 IMPLANT
COVER MAYO STAND STRL (DRAPES) ×2 IMPLANT
COVER SURGICAL LIGHT HANDLE (MISCELLANEOUS) ×2 IMPLANT
COVER WAND RF STERILE (DRAPES) ×2 IMPLANT
DECANTER SPIKE VIAL GLASS SM (MISCELLANEOUS) ×2 IMPLANT
DERMABOND ADVANCED (GAUZE/BANDAGES/DRESSINGS) ×1
DERMABOND ADVANCED .7 DNX12 (GAUZE/BANDAGES/DRESSINGS) ×1 IMPLANT
DRAIN CHANNEL 19F RND (DRAIN) IMPLANT
DRAPE C-ARM 42X120 X-RAY (DRAPES) ×2 IMPLANT
DRAPE WARM FLUID 44X44 (DRAPES) ×2 IMPLANT
DRSG TEGADERM 4X4.75 (GAUZE/BANDAGES/DRESSINGS) ×2 IMPLANT
ELECT REM PT RETURN 15FT ADLT (MISCELLANEOUS) ×2 IMPLANT
ENDOLOOP SUT PDS II  0 18 (SUTURE) ×1
ENDOLOOP SUT PDS II 0 18 (SUTURE) ×1 IMPLANT
EVACUATOR SILICONE 100CC (DRAIN) IMPLANT
GAUZE SPONGE 2X2 8PLY STRL LF (GAUZE/BANDAGES/DRESSINGS) ×1 IMPLANT
GLOVE ECLIPSE 8.0 STRL XLNG CF (GLOVE) ×2 IMPLANT
GLOVE INDICATOR 8.0 STRL GRN (GLOVE) ×2 IMPLANT
GOWN STRL REUS W/TWL XL LVL3 (GOWN DISPOSABLE) ×4 IMPLANT
IRRIG SUCT STRYKERFLOW 2 WTIP (MISCELLANEOUS) ×2
IRRIGATION SUCT STRKRFLW 2 WTP (MISCELLANEOUS) ×1 IMPLANT
KIT BASIN OR (CUSTOM PROCEDURE TRAY) ×2 IMPLANT
KIT TURNOVER KIT A (KITS) ×2 IMPLANT
PAD POSITIONING PINK XL (MISCELLANEOUS) ×2 IMPLANT
POUCH RETRIEVAL ECOSAC 10 (ENDOMECHANICALS) ×1 IMPLANT
POUCH RETRIEVAL ECOSAC 10MM (ENDOMECHANICALS) ×1
PROTECTOR NERVE ULNAR (MISCELLANEOUS) ×2 IMPLANT
SCISSORS LAP 5X35 DISP (ENDOMECHANICALS) ×2 IMPLANT
SET CHOLANGIOGRAPH MIX (MISCELLANEOUS) ×2 IMPLANT
SET TUBE SMOKE EVAC HIGH FLOW (TUBING) ×2 IMPLANT
SHEARS HARMONIC ACE PLUS 36CM (ENDOMECHANICALS) ×2 IMPLANT
SLEEVE SURGEON STRL (DRAPES) ×2 IMPLANT
SPONGE GAUZE 2X2 STER 10/PKG (GAUZE/BANDAGES/DRESSINGS) ×1
SUT MNCRL AB 4-0 PS2 18 (SUTURE) ×2 IMPLANT
SUT PDS AB 1 CT1 27 (SUTURE) ×4 IMPLANT
SYR 20CC LL (SYRINGE) ×2 IMPLANT
TOWEL OR 17X26 10 PK STRL BLUE (TOWEL DISPOSABLE) ×2 IMPLANT
TOWEL OR NON WOVEN STRL DISP B (DISPOSABLE) ×2 IMPLANT
TRAY LAPAROSCOPIC (CUSTOM PROCEDURE TRAY) ×2 IMPLANT
TROCAR BLADELESS OPT 5 100 (ENDOMECHANICALS) ×2 IMPLANT
TROCAR BLADELESS OPT 5 150 (ENDOMECHANICALS) ×2 IMPLANT

## 2019-03-03 NOTE — Anesthesia Procedure Notes (Addendum)
Procedure Name: Intubation Date/Time: 03/03/2019 9:33 AM Performed by: Eben Burow, CRNA Pre-anesthesia Checklist: Patient identified, Emergency Drugs available, Suction available, Patient being monitored and Timeout performed Patient Re-evaluated:Patient Re-evaluated prior to induction Oxygen Delivery Method: Circle system utilized Preoxygenation: Pre-oxygenation with 100% oxygen Induction Type: IV induction and Rapid sequence Laryngoscope Size: Mac and 4 Grade View: Grade II Tube type: Oral Tube size: 7.0 mm Number of attempts: 1 Airway Equipment and Method: Stylet Placement Confirmation: ETT inserted through vocal cords under direct vision,  positive ETCO2 and breath sounds checked- equal and bilateral Secured at: 22 cm Tube secured with: Tape Dental Injury: Teeth and Oropharynx as per pre-operative assessment

## 2019-03-03 NOTE — Op Note (Signed)
03/03/2019  PATIENT:  Wendy Zimmerman  58 y.o. female  Patient Care Team: Kelton Pillar, MD as PCP - General (Family Medicine)  PRE-OPERATIVE DIAGNOSIS:    Acute on Chronic Calculus Cholecystitis  CBD stones with cholangitis s/p ERCP 02/28/2019  POST-OPERATIVE DIAGNOSIS:   Acute on Chronic Calculus Cholecystitis CBD stones with cholangitis s/p ERCP 02/28/2019 with perisistent scant nonobstructing choledocholithiasis Liver: Fatty steatohepatitis  Umbilical hernia  PROCEDURE:  SINGLE SITE Laparoscopic cholecystectomy with intraoperative cholangiogram  Primary umbilical hernia repair  SURGEON:  Adin Hector, MD, FACS.  ASSISTANT: OR Staff   ANESTHESIA:    General with endotracheal intubation Local anesthetic as a field block  EBL:  (See Anesthesia Intraoperative Record) Total I/O In: -  Out: 20 [Blood:20]  Delay start of Pharmacological VTE agent (>24hrs) due to surgical blood loss or risk of bleeding:  no  DRAINS: None   SPECIMEN: Gallbladder    DISPOSITION OF SPECIMEN:  PATHOLOGY  COUNTS:  YES  PLAN OF CARE: Admit for overnight observation  PATIENT DISPOSITION:  PACU - hemodynamically stable.  INDICATION: Pleasant obese woman came in with cholangitis and found to have choledocholithiasis.  Admitted.  Underwent ERCP.  Able to work around a duodenal diverticulum and do sphincterotomy.  Sweeping done in some suspected stones released.  Numbers and pain improved.  Recommendation for 7 days of Cipro and Flagyl antibiotics.  Initially patient wished to consider outpatient cholecystectomy but then changed mind and wished to have inpatient cholecystectomy.  Consultation made yesterday.  Interested in surgery this morning  The anatomy & physiology of hepatobiliary & pancreatic function was discussed.  The pathophysiology of gallbladder dysfunction was discussed.  Natural history risks without surgery was discussed.   I feel the risks of no intervention will lead to serious  problems that outweigh the operative risks; therefore, I recommended cholecystectomy to remove the pathology.  I explained laparoscopic techniques with possible need for an open approach.  Probable cholangiogram to evaluate the bilary tract was explained as well.    Risks such as bleeding, infection, abscess, leak, injury to other organs, need for further treatment, heart attack, death, and other risks were discussed.  I noted a good likelihood this will help address the problem.  Possibility that this will not correct all abdominal symptoms was explained.  Goals of post-operative recovery were discussed as well.  We will work to minimize complications.  An educational handout further explaining the pathology and treatment options was given as well.  Questions were answered.  The patient expresses understanding & wishes to proceed with surgery.  OR FINDINGS: Acute inflammation and edema with mild phlegmon consistent with acute cholecystitis.  Chronic changes in adhesions consistent with chronic cholecystitis.  Dilated cystic duct with persistent small mobile intraluminal filling defects in the distal common bile duct suspicious for persistent choledocholithiasis.  However not obstructing.  Leak at the intubation of the cystic duct due to a very large cystic duct but nowhere else.  Liver: Fatty steatohepatitis  DESCRIPTION:   The patient was identified & brought in the operating room. The patient was positioned supine with arms tucked. SCDs were active during the entire case. The patient underwent general anesthesia without any difficulty.  The abdomen was prepped and draped in a sterile fashion. A Surgical Timeout confirmed our plan.  I made a transverse curvilinear incision through the superior umbilical fold.  I placed a 29mm long port through the supraumbilical fascia using a modified Hassan cutdown technique with umbilical stalk fascial countertraction. I  began carbon dioxide insufflation.  No  change in end tidal CO2 measurement.   Camera inspection revealed no injury.  There was a solitary greater omental adhesion supraumbilically.  Was able to sweep that with the camera.  I proceeded to continue with single site technique. I placed a #5 port in left upper aspect of the wound. I placed a 5 mm atraumatic grasper in the right inferior aspect of the wound.  Freddrick March off a few small greater omental adhesions to the falciform ligament under direct realization.  I turned attention to the right upper quadrant.  Greater omentum was densely adherent to the gallbladder which was edematous.  Freed adhesions of the greater omentum off the gallbladder.  The gallbladder fundus was elevated cephalad. I freed adhesions to the ventral surface of the gallbladder off carefully.  I freed the peritoneal coverings between the gallbladder and the liver on the posteriolateral and anteriomedial walls. I alternated between Harmonic & blunt Maryland dissection to help get a good critical view of the cystic artery and cystic duct.  did further dissection to free 90% of the gallbladder off the liver bed to get a good critical view of the infundibulum and cystic duct. I dissected out the cystic artery; and, after getting a good 360 view, ligated the anterior & posterior branches of the cystic artery close on the infundibulum using the Harmonic ultrasonic dissection.  Freddrick March off the last few attachments from the liver to the posterior gallbladder wall such that the only thing remaining was the cystic duct.  I skeletonized the cystic duct.  I placed a clip on the infundibulum. I did a partial cystic duct-otomy and ensured patency. I placed a 5 [place] cholangiocatheter through a puncture site at the right subcostal ridge of the abdominal wall and directed it into the cystic duct.  It would not pass well.  I repeated ductotomy more proximally.  Able to intubate the dilated cystic duct.  Placed 3 clips to get a pretty good seal around it  with only a very mild leak at the intubation site only.  We ran a cholangiogram with dilute radio-opaque contrast and continuous fluoroscopy. Contrast flowed from a side branch consistent with cystic duct cannulization. Contrast flowed up the common hepatic duct into the right and left intrahepatic chains out to secondary radicals.  Leaking at the cystic intubation site.  Contrast flowed down the common bile duct easily across the normal ampulla into the duodenum.  There were a few very small intraluminal filling defects that were mobile suspicious for persistent distal choledocholithiasis.  However they were 1-2 mm in size and nonobstructing.    I removed the cholangiocatheter.  Because the cystic duct was still rather dilated it would not hold clips, I ligated the cystic duct using 0 PDS Endoloop x2.  I completed cystic duct transection.  I ensured hemostasis on the gallbladder fossa of the liver and elsewhere. I inspected the rest of the abdomen & detected no injury nor bleeding elsewhere.  No evidence of bile leak.  Ligation good on cystic stump.  No bleeding.  I placed the gallbladder inside and eco-sac and removed the gallbladder out the supraumbilical fascia.  Patient had a small but definite umbilical hernia.  I freed the umbilical stalk off the hernia.  I closed the fascia transversely eluding the umbilical hernia with #1 PDS interrupted stitches. I closed the skin using 4-0 monocryl stitch.  Sterile dressing was applied. The patient was extubated & arrived in the PACU in stable  condition..  I had discussed postoperative care with the patient in the holding area. I discussed operative findings, updated the patient's status, discussed probable steps to recovery, and gave postoperative recommendations to the Patient's daughter, Cloyde Reams.  Recommendations were made.  Questions were answered.  She expressed understanding & appreciation.  Adin Hector, M.D., F.A.C.S. Gastrointestinal and Minimally  Invasive Surgery [facility] [address], [place], [place] [address] [phone] Main / Paging  03/03/2019 10:56 AM

## 2019-03-03 NOTE — Progress Notes (Signed)
[facility] Progress Note  3 Days Post-Op  Subjective: CC-  Denies any abdominal pain. Tolerated a regular diet yesterday. Denies n/v. BM yesterday. Ready for surgery. Tbili down to 1.2 today  Objective: Vital signs in last 24 hours: Temp:  [97.7 F (36.5 C)-98.4 F (36.9 C)] 98.4 F (36.9 C) (06/08 0605) Pulse Rate:  [66-72] 66 (06/08 0605) Resp:  [18] 18 (06/08 0605) BP: (117-132)/(67-77) 117/67 (06/08 0605) SpO2:  [96 %-98 %] 98 % (06/08 0605) Last BM Date: 03/02/19  Intake/Output from previous day: 06/07 0701 - 06/08 0700 In: 1560 [P.O.:1560] Out: 2200 [Urine:2200] Intake/Output this shift: No intake/output data recorded.  PE: Gen:  Alert, NAD, pleasant HEENT: EOM's intact, pupils equal and round Card:  RRR Pulm:  CTAB, no W/R/R, effort normal Abd: Soft, NT/ND, +BS, no HSM Ext:  Calves soft and nontender without edema Psych: A&Ox3  Skin: no rashes noted, warm and dry  Lab Results:  Recent Labs    03/01/19 0352 03/03/19 0329  WBC 7.3 6.1  HGB 11.8* 12.0  HCT 36.8 38.7  PLT 258 296   BMET Recent Labs    03/01/19 0352 03/03/19 0329  NA 141 141  K 3.5 3.9  CL 108 105  CO2 26 29  GLUCOSE 133* 109*  BUN 10 14  CREATININE 0.98 0.98  CALCIUM 8.5* 8.7*   PT/INR No results for input(s): LABPROT, INR in the last 72 hours. CMP     Component Value Date/Time   NA 141 03/03/2019 0329   K 3.9 03/03/2019 0329   CL 105 03/03/2019 0329   CO2 29 03/03/2019 0329   GLUCOSE 109 (H) 03/03/2019 0329   BUN 14 03/03/2019 0329   CREATININE 0.98 03/03/2019 0329   CALCIUM 8.7 (L) 03/03/2019 0329   PROT 6.6 03/03/2019 0329   ALBUMIN 3.3 (L) 03/03/2019 0329   AST 59 (H) 03/03/2019 0329   ALT 125 (H) 03/03/2019 0329   ALKPHOS 318 (H) 03/03/2019 0329   BILITOT 1.2 03/03/2019 0329   GFRNONAA >60 03/03/2019 0329   GFRAA >60 03/03/2019 0329   Lipase     Component Value Date/Time   LIPASE 35 02/27/2019 0612       Studies/Results: No results  found.  Anti-infectives: Anti-infectives (From admission, onward)   Start     Dose/Rate Route Frequency Ordered Stop   03/03/19 0815  cefTRIAXone (ROCEPHIN) 2 g in sodium chloride 0.9 % 100 mL IVPB     2 g 200 mL/hr over 30 Minutes Intravenous On call to O.R. 03/03/19 4656 03/04/19 0559   03/03/19 0807  metroNIDAZOLE (FLAGYL) IVPB 500 mg     500 mg 100 mL/hr over 60 Minutes Intravenous Every 8 hours 03/03/19 0807     03/02/19 1100  amoxicillin-clavulanate (AUGMENTIN) 875-125 MG per tablet 1 tablet  Status:  Discontinued     1 tablet Oral Every 12 hours 03/02/19 0957 03/03/19 0737   03/01/19 1600  metroNIDAZOLE (FLAGYL) tablet 500 mg  Status:  Discontinued     500 mg Oral 3 times daily 03/01/19 1529 03/02/19 0957   02/28/19 2200  ciprofloxacin (CIPRO) IVPB 400 mg  Status:  Discontinued     400 mg 200 mL/hr over 60 Minutes Intravenous Every 12 hours 02/28/19 1456 03/02/19 0957   02/28/19 1500  ciprofloxacin (CIPRO) IVPB 400 mg  Status:  Discontinued     400 mg 200 mL/hr over 60 Minutes Intravenous Every 12 hours 02/28/19 1459 02/28/19 1459   02/28/19 1500  ciprofloxacin (CIPRO) IVPB  200 mg  Status:  Discontinued     200 mg 100 mL/hr over 60 Minutes Intravenous Every 12 hours 02/28/19 1455 02/28/19 1456       Assessment/Plan HTN HLD  Choledocholithiasis - s/p ERCP 6/5 - Tbili down to 1.2 today - Plan for laparoscopic cholecystectomy with IOC today. Keep NPO.   ID - cipro 6/5>>6/7, flagyl 6/6>>, augmentin 6/7>>6/8. Will need 7 day course abx for cholangitis (cipro/flagyl or augmentin) FEN - IVF, NPO VTE - SCDs Foley - none Follow up - TBD   LOS: 4 days    Wellington Hampshire , [facility] 03/03/2019, 8:10 AM Pager: [phone] Mon-Thurs 7:00 am-4:30 pm Fri 7:00 am -11:30 AM Sat-Sun 7:00 am-11:30 am

## 2019-03-03 NOTE — Transfer of Care (Signed)
Immediate Anesthesia Transfer of Care Note  Patient: Wendy Zimmerman  Procedure(s) Performed: LAPAROSCOPIC CHOLECYSTECTOMY SINGLE SITE WITH INTRA OPERATIVE CHOLANGIOGRAM AND REPAIR OF UMBILICAL HERNIA (N/A )  Patient Location: PACU  Anesthesia Type:General  Level of Consciousness: awake, alert  and oriented  Airway & Oxygen Therapy: Patient Spontanous Breathing and Patient connected to face mask oxygen  Post-op Assessment: Report given to RN and Post -op Vital signs reviewed and stable  Post vital signs: Reviewed and stable  Last Vitals:  Vitals Value Taken Time  BP 141/70 03/03/2019 11:02 AM  Temp    Pulse 76 03/03/2019 11:05 AM  Resp 11 03/03/2019 11:05 AM  SpO2 100 % 03/03/2019 11:05 AM  Vitals shown include unvalidated device data.  Last Pain:  Vitals:   03/03/19 0833  TempSrc: Oral  PainSc: 0-No pain      Patients Stated Pain Goal: 4 (58/30/94 0768)  Complications: No apparent anesthesia complications

## 2019-03-03 NOTE — Progress Notes (Signed)
Called daughter Cloyde Reams, did not answer, left a message.

## 2019-03-03 NOTE — Progress Notes (Signed)
PROGRESS NOTE    Wendy Zimmerman  MVH:[id number] DOB: 1961/06/10 DOA: 02/27/2019 PCP: Kelton Pillar, MD    Brief Narrative:  58 year old female who presented with severe abdominal pain. She does have significant past medical history for hypertension, dyslipidemia, and Bowen'sdisease. Reported7 days of severe abdominal pain, associated with nausea,vomiting, and low appetite. Pain localized in the epigastric, periumbilical, right upper quadrant. On her initial physical examination her temperature was 98.7, heart rate 113, blood pressure 143/77, her lungs were clear to auscultation bilaterally, heart S1-S2 present and rhythmic,her abdomen was soft nontender, no lower extremity edema. Sodium 139, potassium 3.3, chloride 106, bicarb 26, glucose 142, BUN 17, creatinine 1.97, AST 236, ALT 174, total bilirubins 3.9, white count 13.0, hemoglobin 12.0, hematocrit 37.1, platelets 250.SARS COVID-19 was negative.Urinalysis with specific gravity >1.046.CT of the abdomen with mild intra-and extrahepatic bile duct dilatation, sigmoid diverticulosis,fatty right inguinal hernia. Right upper quadrant ultrasound with no acute changes. EKG 108 bpm, normal axis, normal intervals, sinus rhythm with normal conduction, no ST segment or T wave changes.  Patient was admitted to the hospital with a working diagnosis of abdominal pain likely related tobilairydilatation/ biliary colic, complicated with elevated transaminases and hyperbilirubinemia   Assessment & Plan:   Principal Problem:   Abdominal pain Active Problems:   HTN (hypertension)   Hyperlipidemia   Hypokalemia   Nausea and vomiting   Transaminitis   1. Biliary duct dilatation,biliary colic, likely passed stone. sp ERCP, sp sphincterectomy. Sp laparoscopic cholecystectomy. Will continue post operative care per surgery recommendations. Today with significant improvement of LFT, with AST 59, ALT 125, T Bili 1,2. Continue with Augmentin to  complete a total of 7 days.   2. HTN.Continue blood pressure control with valsartan and HCTZ. Systolic 841 mmHg.   3. Hypokalemia.K today at 3,9 with serum cr at 0.98, serum bicarbonate at 29.   4. Dyslipidemia.Will resume statin at discharge.   5. Obesity.CalculatedBMI is 37,9. Will need outpatient follow up.   6, Mild allergic reaction. Bilateral upper extremities, rash has resolved with benadryl.   DVT prophylaxis:enoxaparin Code Status:full Family Communication:no family at the bedside Disposition Plan/ discharge barriers:pending surgical intervention.  Body mass index is 34.54 kg/m. Malnutrition Type:      Malnutrition Characteristics:      Nutrition Interventions:     RN Pressure Injury Documentation:     Consultants:   GI   Surgery   Procedures:   Ercp  Cholecystectomy   Antimicrobials:   Augmentin      Subjective: Patient with post -operative abdominal pain, no nausea or vomiting, no chest pain or dyspnea.   Objective: Vitals:   03/03/19 1115 03/03/19 1130 03/03/19 1145 03/03/19 1212  BP: (!) 141/77 (!) 148/79 (!) 145/81 (!) 153/75  Pulse: 66 73 79 76  Resp: 12 14 14 18   Temp:    97.8 F (36.6 C)  TempSrc:    Oral  SpO2: 94% 96% 96% 92%  Weight:      Height:        Intake/Output Summary (Last 24 hours) at 03/03/2019 1219 Last data filed at 03/03/2019 1105 Gross per 24 hour  Intake 1926 ml  Output 1720 ml  Net 206 ml   Filed Weights   02/27/19 2033 02/28/19 1139 03/03/19 0833  Weight: 97.1 kg 97.1 kg 88.5 kg    Examination:   General: In mild pain, no dyspnea.  Neurology: Awake and alert, non focal  E ENT: mild pallor, no icterus, oral mucosa moist Cardiovascular: No JVD. S1-S2  present, rhythmic, no gallops, rubs, or murmurs. No lower extremity edema. Pulmonary: positive breath sounds bilaterally, adequate air movement, no wheezing, rhonchi or rales. Gastrointestinal. Abdomen mild distended, tender to  palpation with no organomegaly, no rebound or guarding Skin. No rashes Musculoskeletal: no joint deformities     Data Reviewed: I have personally reviewed following labs and imaging studies  CBC: Recent Labs  Lab 02/27/19 0612 02/27/19 1507 02/28/19 0405 03/01/19 0352 03/03/19 0329  WBC 10.4 13.0* 8.1 7.3 6.1  NEUTROABS  --  11.5*  --  6.0 3.2  HGB 13.4 12.0 11.8* 11.8* 12.0  HCT 42.4 37.1 38.5 36.8 38.7  MCV 86.5 87.5 90.4 88.7 88.6  PLT 296 250 249 258 979   Basic Metabolic Panel: Recent Labs  Lab 02/27/19 0612 02/27/19 1507 02/28/19 0405 03/01/19 0352 03/03/19 0329  NA 139 139 138 141 141  K 2.9* 3.3* 3.4* 3.5 3.9  CL 101 106 105 108 105  CO2 26 26 24 26 29   GLUCOSE 152* 142* 134* 133* 109*  BUN 16 17 12 10 14   CREATININE 1.11* 0.97 0.81 0.98 0.98  CALCIUM 9.1 8.3* 8.3* 8.5* 8.7*   GFR: Estimated Creatinine Clearance: 64.4 mL/min (by C-G formula based on SCr of 0.98 mg/dL). Liver Function Tests: Recent Labs  Lab 02/27/19 1507 02/28/19 0405 03/01/19 0352 03/02/19 0339 03/03/19 0329  AST 236* 163* 108*  108* 110* 59*  ALT 174* 162* 135*  137* 156* 125*  ALKPHOS 250* 258* 286*  285* 302* 318*  BILITOT 3.9* 4.5* 4.0*  4.1* 2.1* 1.2  PROT 6.9 6.7 7.0  7.1 6.3* 6.6  ALBUMIN 3.5 3.4* 3.5  3.5 3.1* 3.3*   Recent Labs  Lab 02/27/19 0612  LIPASE 35   No results for input(s): AMMONIA in the last 168 hours. Coagulation Profile: No results for input(s): INR, PROTIME in the last 168 hours. Cardiac Enzymes: No results for input(s): CKTOTAL, CKMB, CKMBINDEX, TROPONINI in the last 168 hours. BNP (last 3 results) No results for input(s): PROBNP in the last 8760 hours. HbA1C: No results for input(s): HGBA1C in the last 72 hours. CBG: No results for input(s): GLUCAP in the last 168 hours. Lipid Profile: No results for input(s): CHOL, HDL, LDLCALC, TRIG, CHOLHDL, LDLDIRECT in the last 72 hours. Thyroid Function Tests: No results for input(s): TSH,  T4TOTAL, FREET4, T3FREE, THYROIDAB in the last 72 hours. Anemia Panel: No results for input(s): VITAMINB12, FOLATE, FERRITIN, TIBC, IRON, RETICCTPCT in the last 72 hours.    Radiology Studies: I have reviewed all of the imaging during this hospital visit personally     Scheduled Meds: . acetaminophen  1,000 mg Oral TID  . acetaminophen  1,000 mg Oral TID  . aspirin EC  81 mg Oral Daily  . atorvastatin  20 mg Oral Daily  . buPROPion  150 mg Oral QODAY  . cycloSPORINE  1 drop Both Eyes BID  . gabapentin  300 mg Oral BID  . gabapentin  300 mg Oral BID  . hydrochlorothiazide  12.5 mg Oral Daily  . lip balm  1 application Topical BID  . multivitamin with minerals  1 tablet Oral Daily  . mupirocin ointment   Nasal BID  . pantoprazole  40 mg Oral Q0600  . psyllium  1 packet Oral Daily  . sodium chloride flush  3 mL Intravenous Q12H  . valsartan  320 mg Oral Daily   Continuous Infusions: . sodium chloride    . lactated ringers    . lactated  ringers    . metronidazole    . ondansetron (ZOFRAN) IV       LOS: 4 days        Mauricio Gerome Apley, MD

## 2019-03-03 NOTE — Discharge Summary (Addendum)
Physician Discharge Summary  CHINWE LOPE AOZ:[id number] DOB: 1961-04-14 DOA: 02/27/2019  PCP: Kelton Pillar, MD  Admit date: 02/27/2019 Discharge date: 03/03/2019  Admitted From: Home  Disposition:  Home  Recommendations for Outpatient Follow-up and new medication changes:  1. Follow up with Dr. Laurann Montana in 7 days.  2. Continue taking Augmentin for 4 more days. 3. Follow up with surgery as scheduled.   Home Health: no   Equipment/Devices: no    Discharge Condition: stable  CODE STATUS: full  Diet recommendation: heart healthy   Brief/Interim Summary: 58 year old female who presented with severe abdominal pain. She does have significant past medical history for hypertension, dyslipidemia, and Bowen'sdisease. Reported7 days of severe abdominal pain, associated with nausea,vomiting, and low appetite. Pain localized in the epigastric, periumbilical, right upper quadrant. On her initial physical examination her temperature was 98.7, heart rate 113, blood pressure 143/77, her lungs were clear to auscultation bilaterally, heart S1-S2 present and rhythmic,her abdomen was soft nontender, no lower extremity edema. Sodium 139, potassium 3.3, chloride 106, bicarb 26, glucose 142, BUN 17, creatinine 1.97, AST 236, ALT 174, total bilirubins 3.9, white count 13.0, hemoglobin 12.0, hematocrit 37.1, platelets 250.SARS COVID-19 was negative.Urinalysis with specific gravity >1.046.CT of the abdomen with mild intra-and extrahepatic bile duct dilatation, sigmoid diverticulosis,fatty right inguinal hernia. Right upper quadrant ultrasound with no acute changes. EKG 108 bpm, normal axis, normal intervals, sinus rhythm with normal conduction, no ST segment or T wave changes.  Patient was admitted to the hospital with a working diagnosis of abdominal pain likely related tobilairydilatation/ biliary colic, complicated with elevated transaminases and hyperbilirubinemia  1.  Biliary ductal dilatation with  biliary colic related past biliary stone, complicated with acute cholecystitis.  Patient was admitted to the medical ward, she was placed on intravenous fluids, as needed IV analgesics and antiemetics.  She underwent further work-up with ERCP and sphincterectomy, the common bile duct had a filling defect suspected to be stone versus air bubble, no stones were obtained from the common bile duct during a swept maneuver but positive sludge and pus.  A cholangiogram was negative.  It was recommended to start patient on 7-day course of antibiotic therapy, Her liver function test continue to improve, her discharge total bilirubin normalized at 1.2, AST 59, ALT 125.  Patient was seen by surgery and underwent laparoscopic cholecystectomy with no major complications.  Patient will continue taking antibiotic therapy with Augmentin for 4 more days.  2.  Hypertension.  Initially her antihypertensive agents were held, at discharge she will continue valsartan chlorothiazide.  3.  Hypokalemia.  Potassium was repleted with potassium chloride with no major complications, her kidney function remained stable.  4.  Dyslipidemia.  Continue statin therapy.  5.  Obesity.  BMI is 37.9.  6.  Transient allergic reaction.  Patient had an unspecific allergic reaction with mild rash on her upper extremities with improved with 1 dose of Benadryl   Discharge Diagnoses:  Principal Problem:   Acute cholecystitis Active Problems:   HTN (hypertension)   Hyperlipidemia   Hypokalemia   Nausea and vomiting   Transaminitis    Discharge Instructions  Discharge Instructions    Call MD for:   Complete by:  As directed    FEVER > 101.5 F  (temperatures < 101.5 F are not significant)   Call MD for:  extreme fatigue   Complete by:  As directed    Call MD for:  persistant dizziness or light-headedness   Complete by:  As directed  Call MD for:  persistant nausea and vomiting   Complete by:  As directed    Call MD for:   redness, tenderness, or signs of infection (pain, swelling, redness, odor or green/yellow discharge around incision site)   Complete by:  As directed    Call MD for:  severe uncontrolled pain   Complete by:  As directed    Diet - low sodium heart healthy   Complete by:  As directed    Diet - low sodium heart healthy   Complete by:  As directed    Start with a bland diet such as soups, liquids, starchy foods, low fat foods, etc. the first few days at home. Gradually advance to a solid, low-fat, high fiber diet by the end of the first week at home.   Add a fiber supplement to your diet (Metamucil, etc) If you feel full, bloated, or constipated, stay on a full liquid or pureed/blenderized diet for a few days until you feel better and are no longer constipated.   Diet - low sodium heart healthy   Complete by:  As directed    Discharge instructions   Complete by:  As directed    Please follow with primary care in 7 days, and surgery as scheduled.   Discharge instructions   Complete by:  As directed    See Discharge Instructions If you are not getting better after two weeks or are noticing you are getting worse, contact our office [phone] for further advice.  We may need to adjust your medications, re-evaluate you in the office, send you to the emergency room, or see what other things we can do to help. The clinic staff is available to answer your questions during regular business hours (8:30am-5pm).  Please don't hesitate to call and ask to speak to one of our nurses for clinical concerns.    A surgeon from [facility] is always on call at the hospitals 24 hours/day If you have a medical emergency, go to the nearest emergency room or call 911.   Discharge instructions   Complete by:  As directed    Please follow with primary care in 7 days.   Discharge wound care:   Complete by:  As directed    It is good for closed incisions and even open wounds to be washed every day.   Shower every day.  Short baths are fine.  Wash the incisions and wounds clean with soap & water.     You may leave closed incisions open to air if it is dry.   You may cover the incision with clean gauze & replace it after your daily shower for comfort.   Driving Restrictions   Complete by:  As directed    You may drive when: - you are no longer taking narcotic prescription pain medication - you can comfortably wear a seatbelt - you can safely make sudden turns/stops without pain.   Increase activity slowly   Complete by:  As directed    Increase activity slowly   Complete by:  As directed    Start light daily activities --- self-care, walking, climbing stairs- beginning the day after surgery.  Gradually increase activities as tolerated.  Control your pain to be active.  Stop when you are tired.  Ideally, walk several times a day, eventually an hour a day.   Most people are back to most day-to-day activities in a few weeks.  It takes 4-6 weeks to get back  to unrestricted, intense activity. If you can walk 30 minutes without difficulty, it is safe to try more intense activity such as jogging, treadmill, bicycling, low-impact aerobics, swimming, etc. Save the most intensive and strenuous activity for last (Usually 4-8 weeks after surgery) such as sit-ups, heavy lifting, contact sports, etc.  Refrain from any intense heavy lifting or straining until you are off narcotics for pain control.  You will have off days, but things should improve week-by-week. DO NOT PUSH THROUGH PAIN.  Let pain be your guide: If it hurts to do something, don't do it.   Increase activity slowly   Complete by:  As directed    Lifting restrictions   Complete by:  As directed    If you can walk 30 minutes without difficulty, it is safe to try more intense activity such as jogging, treadmill, bicycling, low-impact aerobics, swimming, etc. Save the most intensive and strenuous activity for last (Usually 4-8 weeks after  surgery) such as sit-ups, heavy lifting, contact sports, etc.   Refrain from any intense heavy lifting or straining until you are off narcotics for pain control.  You will have off days, but things should improve week-by-week. DO NOT PUSH THROUGH PAIN.  Let pain be your guide: If it hurts to do something, don't do it.  Pain is your body warning you to avoid that activity for another week until the pain goes down.   May shower / Bathe   Complete by:  As directed    May walk up steps   Complete by:  As directed    Sexual Activity Restrictions   Complete by:  As directed    You may have sexual intercourse when it is comfortable. If it hurts to do something, stop.     Allergies as of 03/03/2019      Reactions   Lisinopril Swelling, Other (See Comments)   Face and tongue swelling      Medication List    STOP taking these medications   acetaminophen 500 MG tablet Commonly known as:  TYLENOL     TAKE these medications   amoxicillin-clavulanate 875-125 MG tablet Commonly known as:  AUGMENTIN Take 1 tablet by mouth every 12 (twelve) hours for 4 days.   aspirin EC 81 MG tablet Take 81 mg by mouth daily.   atorvastatin 20 MG tablet Commonly known as:  LIPITOR Take 20 mg by mouth daily.   buPROPion 150 MG 24 hr tablet Commonly known as:  WELLBUTRIN XL Take 150 mg by mouth every other day.   cycloSPORINE 0.05 % ophthalmic emulsion Commonly known as:  RESTASIS Place 1 drop into both eyes 2 (two) times daily.   esomeprazole 20 MG capsule Commonly known as:  NEXIUM Take 20 mg by mouth 2 (two) times a day.   ibuprofen 200 MG tablet Commonly known as:  ADVIL You can take 2-3 tablets every 6 hours for pain not relieved by plain Tylenol/Acetaminophen.  You can start ibuprofen about 2 hours after you take your Tylenol/acetaminophen.  Alternate these as needed for pain relief.  You can buy this over the counter at any drug store.   multivitamin with minerals Tabs tablet Take 1 tablet by  mouth daily.   traMADol 50 MG tablet Commonly known as:  ULTRAM Take 1-2 tablets (50-100 mg total) by mouth every 6 (six) hours as needed for moderate pain or severe pain.   valsartan-hydrochlorothiazide 320-12.5 MG tablet Commonly known as:  DIOVAN-HCT Take 1 tablet by mouth daily.  Discharge Care Instructions  (From admission, onward)         Start     Ordered   03/03/19 0000  Discharge wound care:    Comments:  It is good for closed incisions and even open wounds to be washed every day.  Shower every day.  Short baths are fine.  Wash the incisions and wounds clean with soap & water.     You may leave closed incisions open to air if it is dry.   You may cover the incision with clean gauze & replace it after your daily shower for comfort.   03/03/19 [facility]. Schedule an appointment as soon as possible for a visit on 03/18/2019.   Specialty:  General Surgery Why:  Your follow up will be a phone call appointment  (due to the [facility] virus, to limit exposure.) Carlena Hurl will call you at 8:45AM.  Please email a picture if you have an concerns with you wound to : [email or web address]  Contact information: [place] [phone]         Allergies  Allergen Reactions  . Lisinopril Swelling and Other (See Comments)    Face and tongue swelling    Consultations:  GI   Surgery    Procedures/Studies: Dg Cholangiogram Operative  Result Date: 03/03/2019 CLINICAL DATA:  58 year old female with a history of cholelithiasis/choledocholithiasis EXAM: INTRAOPERATIVE CHOLANGIOGRAM TECHNIQUE: Cholangiographic images from the C-arm fluoroscopic device were submitted for interpretation post-operatively. Please see the procedural report for the amount of contrast and the fluoroscopy time utilized. COMPARISON:  ERCP 02/28/2019, CT 02/27/2019  FINDINGS: Surgical instruments project over the upper abdomen. There is cannulation of the cystic duct/gallbladder neck, with antegrade infusion of contrast. Caliber of the extrahepatic ductal system borderline enlarged. The distal common bile duct appears somewhat distorted, potentially secondary to the presence of a known duodenal diverticulum. Free flow of contrast across the ampulla. IMPRESSION: Intraoperative cholangiogram demonstrates extrahepatic biliary ducts of unremarkable caliber, with no definite filling defects identified. Free flow of contrast across the ampulla. Please refer to the dictated operative report for full details of intraoperative findings and procedure Electronically Signed   By: Corrie Mckusick D.O.   On: 03/03/2019 12:01   Ct Abdomen Pelvis W Contrast  Result Date: 02/27/2019 CLINICAL DATA:  Severe abdominal pain since last night. Nausea and diarrhea EXAM: CT ABDOMEN AND PELVIS WITH CONTRAST TECHNIQUE: Multidetector CT imaging of the abdomen and pelvis was performed using the standard protocol following bolus administration of intravenous contrast. CONTRAST:  167mL OMNIPAQUE IOHEXOL 300 MG/ML  SOLN COMPARISON:  10/22/2017 FINDINGS: Lower chest: Fluid seen in the lower esophagus, presumed reflux. Mild atelectasis or scarring at the lung bases. Hepatobiliary: No focal liver abnormality.Prominent diameter of intrahepatic bile ducts. Common bile duct is also newly enlarged at 13 mm diameter on coronal reformats. No calcified choledocholithiasis or acute cholecystitis. Pancreas: Unremarkable. Spleen: Unremarkable. Adrenals/Urinary Tract: Negative adrenals. No hydronephrosis or stone. Small left renal cystic density. Unremarkable bladder. Stomach/Bowel: No obstruction. No appendicitis. Sigmoid diverticulosis. Vascular/Lymphatic: No acute vascular abnormality. No mass or adenopathy. Reproductive:Hysterectomy. Other: No ascites or pneumoperitoneum.  Fatty right inguinal hernia. Musculoskeletal:  No acute abnormalities. Remote fractures of the right hemipelvis with right sacroiliac ankylosis. IMPRESSION: 1. Mild intra and extrahepatic bile duct dilatation that is new from 2019, recommend sonographic correlation given LFTs. 2. Sigmoid diverticulosis. 3. Fatty  right inguinal hernia. Electronically Signed   By: Monte Fantasia M.D.   On: 02/27/2019 08:19   Dg Ercp Biliary & Pancreatic Ducts  Result Date: 02/28/2019 CLINICAL DATA:  Sphincterotomy for possible CBD stones. EXAM: ERCP TECHNIQUE: Multiple spot images obtained with the fluoroscopic device and submitted for interpretation post-procedure. COMPARISON:  CT abdomen pelvis-02/27/2019 FLUOROSCOPY TIME:  3 minutes, 34 seconds FINDINGS: Seven spot fluoroscopic images of the right upper abdominal quadrant during ERCP are provided for review Initial image demonstrates an ERCP probe overlying the right upper abdominal quadrant Subsequent images demonstrate selective cannulation opacification of the CBD which appears mildly dilated. Subsequent images demonstrate insufflation of a balloon within the mid/distal aspect of the CBD with subsequent biliary sweeping and presumed sphincterotomy. Completion image demonstrates a presumed air bubble within the distal aspect of the CBD. IMPRESSION: ERCP with biliary sweeping and presumed sphincterotomy. These images were submitted for radiologic interpretation only. Please see the procedural report for the amount of contrast and the fluoroscopy time utilized. Electronically Signed   By: Sandi Mariscal M.D.   On: 02/28/2019 14:39   US Abdomen Limited Ruq  Result Date: 02/24/2019 CLINICAL DATA:  Abdominal pain and nausea for 1 week, history hypertension EXAM: ULTRASOUND ABDOMEN LIMITED RIGHT UPPER QUADRANT COMPARISON:  10/22/2017 FINDINGS: Gallbladder: Normally distended without stones or wall thickening. No pericholecystic fluid or sonographic Murphy sign. Common bile duct: Diameter: 5 mm diameter, normal Liver: Suboptimal  assessment of intrahepatic detail due to sound attenuation and body habitus. Grossly normal cortical echogenicity. No hepatic mass or nodularity grossly visualized. Portal vein is patent on color Doppler imaging with normal direction of blood flow towards the liver. No RIGHT upper quadrant free fluid. IMPRESSION: No definite RIGHT upper quadrant sonographic abnormalities identified. Electronically Signed   By: Lavonia Dana M.D.   On: 02/24/2019 14:50      Procedures: ERCP with sphincterotomy                        Scopic cholecystectomy  Subjective: Patient is feeling better her postop abdominal pain continues to improve, she is tolerating p.o. diet adequately.  Discharge Exam: Vitals:   03/03/19 1416 03/03/19 1520  BP: 139/88 134/86  Pulse: 72 93  Resp: 13 16  Temp: 98.1 F (36.7 C) 98.7 F (37.1 C)  SpO2: 96% 93%   Vitals:   03/03/19 1212 03/03/19 1310 03/03/19 1416 03/03/19 1520  BP: (!) 153/75 (!) 149/84 139/88 134/86  Pulse: 76 69 72 93  Resp: 18 16 13 16   Temp: 97.8 F (36.6 C) 97.8 F (36.6 C) 98.1 F (36.7 C) 98.7 F (37.1 C)  TempSrc: Oral Oral Oral   SpO2: 92% 94% 96% 93%  Weight:      Height:        General: Not in pain or dyspnea  Neurology: Awake and alert, non focal  E ENT: no pallor, no icterus, oral mucosa moist Cardiovascular: No JVD. S1-S2 present, rhythmic, no gallops, rubs, or murmurs. No lower extremity edema. Pulmonary: vesicular breath sounds bilaterally, adequate air movement, no wheezing, rhonchi or rales. Gastrointestinal. Abdomen protuberant with no organomegaly, non tender, no rebound or guarding Skin. No rashes Musculoskeletal: no joint deformities   The results of significant diagnostics from this hospitalization (including imaging, microbiology, ancillary and laboratory) are listed below for reference.     Microbiology: Recent Results (from the past 240 hour(s))  SARS Coronavirus 2 (CEPHEID - Performed in [place] hospital lab),  [facility]  Status: None   Collection Time: 02/27/19 10:53 AM  Result Value Ref Range Status   SARS Coronavirus 2 NEGATIVE NEGATIVE Final    Comment: (NOTE) If result is NEGATIVE SARS-CoV-2 target nucleic acids are NOT DETECTED. The SARS-CoV-2 RNA is generally detectable in upper and lower  respiratory specimens during the acute phase of infection. The lowest  concentration of SARS-CoV-2 viral copies this assay can detect is 250  copies / mL. A negative result does not preclude SARS-CoV-2 infection  and should not be used as the sole basis for treatment or other  patient management decisions.  A negative result may occur with  improper specimen collection / handling, submission of specimen other  than nasopharyngeal swab, presence of viral mutation(s) within the  areas targeted by this assay, and inadequate number of viral copies  (<250 copies / mL). A negative result must be combined with clinical  observations, patient history, and epidemiological information. If result is POSITIVE SARS-CoV-2 target nucleic acids are DETECTED. The SARS-CoV-2 RNA is generally detectable in upper and lower  respiratory specimens dur ing the acute phase of infection.  Positive  results are indicative of active infection with SARS-CoV-2.  Clinical  correlation with patient history and other diagnostic information is  necessary to determine patient infection status.  Positive results do  not rule out bacterial infection or co-infection with other viruses. If result is PRESUMPTIVE POSTIVE SARS-CoV-2 nucleic acids MAY BE PRESENT.   A presumptive positive result was obtained on the submitted specimen  and confirmed on repeat testing.  While 2019 novel coronavirus  (SARS-CoV-2) nucleic acids may be present in the submitted sample  additional confirmatory testing may be necessary for epidemiological  and / or clinical management purposes  to differentiate between  SARS-CoV-2 and other Sarbecovirus  currently known to infect humans.  If clinically indicated additional testing with an alternate test  methodology [phone]) is advised. The SARS-CoV-2 RNA is generally  detectable in upper and lower respiratory sp ecimens during the acute  phase of infection. The expected result is Negative. Fact Sheet for Patients:  StrictlyIdeas.no Fact Sheet for Healthcare Providers: BankingDealers.co.za This test is not yet approved or cleared by the [place] FDA and has been authorized for detection and/or diagnosis of SARS-CoV-2 by FDA under an Emergency Use Authorization (EUA).  This EUA will remain in effect (meaning this test can be used) for the duration of the COVID-19 declaration under Section 564(b)(1) of the Act, 21 U.S.C. section 360bbb-3(b)(1), unless the authorization is terminated or revoked sooner. Performed at [facility], [place] [address], [place], [place] [address]   Surgical pcr screen     Status: Abnormal   Collection Time: 02/27/19 10:50 PM  Result Value Ref Range Status   MRSA, PCR NEGATIVE NEGATIVE Final   Staphylococcus aureus POSITIVE (A) NEGATIVE Final    Comment: (NOTE) The Xpert SA Assay (FDA approved for NASAL specimens in patients 50 years of age and older), is one component of a comprehensive surveillance program. It is not intended to diagnose infection nor to guide or monitor treatment. Performed at [facility], [place] [address], [place], [place] [address]      Labs: BNP (last 3 results) No results for input(s): BNP in the last 8760 hours. Basic Metabolic Panel: Recent Labs  Lab 02/27/19 0612 02/27/19 1507 02/28/19 0405 03/01/19 0352 03/03/19 0329  NA 139 139 138 141 141  K 2.9* 3.3* 3.4* 3.5 3.9  CL 101 106 105 108 105  CO2 26 26 24 26 29   GLUCOSE 152* 142* 134* 133* 109*  BUN 16 17 12 10 14   CREATININE 1.11* 0.97 0.81 0.98 0.98  CALCIUM 9.1 8.3* 8.3* 8.5*  8.7*   Liver Function Tests: Recent Labs  Lab 02/27/19 1507 02/28/19 0405 03/01/19 0352 03/02/19 0339 03/03/19 0329  AST 236* 163* 108*  108* 110* 59*  ALT 174* 162* 135*  137* 156* 125*  ALKPHOS 250* 258* 286*  285* 302* 318*  BILITOT 3.9* 4.5* 4.0*  4.1* 2.1* 1.2  PROT 6.9 6.7 7.0  7.1 6.3* 6.6  ALBUMIN 3.5 3.4* 3.5  3.5 3.1* 3.3*   Recent Labs  Lab 02/27/19 0612  LIPASE 35   No results for input(s): AMMONIA in the last 168 hours. CBC: Recent Labs  Lab 02/27/19 0612 02/27/19 1507 02/28/19 0405 03/01/19 0352 03/03/19 0329  WBC 10.4 13.0* 8.1 7.3 6.1  NEUTROABS  --  11.5*  --  6.0 3.2  HGB 13.4 12.0 11.8* 11.8* 12.0  HCT 42.4 37.1 38.5 36.8 38.7  MCV 86.5 87.5 90.4 88.7 88.6  PLT 296 250 249 258 296   Cardiac Enzymes: No results for input(s): CKTOTAL, CKMB, CKMBINDEX, TROPONINI in the last 168 hours. BNP: Invalid input(s): POCBNP CBG: No results for input(s): GLUCAP in the last 168 hours. D-Dimer No results for input(s): DDIMER in the last 72 hours. Hgb A1c No results for input(s): HGBA1C in the last 72 hours. Lipid Profile No results for input(s): CHOL, HDL, LDLCALC, TRIG, CHOLHDL, LDLDIRECT in the last 72 hours. Thyroid function studies No results for input(s): TSH, T4TOTAL, T3FREE, THYROIDAB in the last 72 hours.  Invalid input(s): FREET3 Anemia work up No results for input(s): VITAMINB12, FOLATE, FERRITIN, TIBC, IRON, RETICCTPCT in the last 72 hours. Urinalysis    Component Value Date/Time   COLORURINE AMBER (A) 02/27/2019 1723   APPEARANCEUR CLEAR 02/27/2019 1723   LABSPEC >1.046 (H) 02/27/2019 1723   PHURINE 5.0 02/27/2019 1723   GLUCOSEU NEGATIVE 02/27/2019 1723   HGBUR NEGATIVE 02/27/2019 1723   BILIRUBINUR SMALL (A) 02/27/2019 1723   KETONESUR NEGATIVE 02/27/2019 1723   PROTEINUR 30 (A) 02/27/2019 1723   NITRITE NEGATIVE 02/27/2019 1723   LEUKOCYTESUR NEGATIVE 02/27/2019 1723   Sepsis Labs Invalid input(s): PROCALCITONIN,  WBC,   LACTICIDVEN Microbiology Recent Results (from the past 240 hour(s))  SARS Coronavirus 2 (CEPHEID - Performed in [place] hospital lab), [facility] Order     Status: None   Collection Time: 02/27/19 10:53 AM  Result Value Ref Range Status   SARS Coronavirus 2 NEGATIVE NEGATIVE Final    Comment: (NOTE) If result is NEGATIVE SARS-CoV-2 target nucleic acids are NOT DETECTED. The SARS-CoV-2 RNA is generally detectable in upper and lower  respiratory specimens during the acute phase of infection. The lowest  concentration of SARS-CoV-2 viral copies this assay can detect is 250  copies / mL. A negative result does not preclude SARS-CoV-2 infection  and should not be used as the sole basis for treatment or other  patient management decisions.  A negative result may occur with  improper specimen collection / handling, submission of specimen other  than nasopharyngeal swab, presence of viral mutation(s) within the  areas targeted by this assay, and inadequate number of viral copies  (<250 copies / mL). A negative result must be combined with clinical  observations, patient history, and epidemiological information. If result is POSITIVE SARS-CoV-2 target nucleic acids are DETECTED. The SARS-CoV-2 RNA is generally detectable in upper and lower  respiratory  specimens dur ing the acute phase of infection.  Positive  results are indicative of active infection with SARS-CoV-2.  Clinical  correlation with patient history and other diagnostic information is  necessary to determine patient infection status.  Positive results do  not rule out bacterial infection or co-infection with other viruses. If result is PRESUMPTIVE POSTIVE SARS-CoV-2 nucleic acids MAY BE PRESENT.   A presumptive positive result was obtained on the submitted specimen  and confirmed on repeat testing.  While 2019 novel coronavirus  (SARS-CoV-2) nucleic acids may be present in the submitted sample  additional confirmatory testing may  be necessary for epidemiological  and / or clinical management purposes  to differentiate between  SARS-CoV-2 and other Sarbecovirus currently known to infect humans.  If clinically indicated additional testing with an alternate test  methodology [phone]) is advised. The SARS-CoV-2 RNA is generally  detectable in upper and lower respiratory sp ecimens during the acute  phase of infection. The expected result is Negative. Fact Sheet for Patients:  StrictlyIdeas.no Fact Sheet for Healthcare Providers: BankingDealers.co.za This test is not yet approved or cleared by the [place] FDA and has been authorized for detection and/or diagnosis of SARS-CoV-2 by FDA under an Emergency Use Authorization (EUA).  This EUA will remain in effect (meaning this test can be used) for the duration of the COVID-19 declaration under Section 564(b)(1) of the Act, 21 U.S.C. section 360bbb-3(b)(1), unless the authorization is terminated or revoked sooner. Performed at [facility], [place] [address] [place], [place] [address]   Surgical pcr screen     Status: Abnormal   Collection Time: 02/27/19 10:50 PM  Result Value Ref Range Status   MRSA, PCR NEGATIVE NEGATIVE Final   Staphylococcus aureus POSITIVE (A) NEGATIVE Final    Comment: (NOTE) The Xpert SA Assay (FDA approved for NASAL specimens in patients 70 years of age and older), is one component of a comprehensive surveillance program. It is not intended to diagnose infection nor to guide or monitor treatment. Performed at [facility], [place] [address], [place], [place] [address]      Time coordinating discharge: 45 minutes  SIGNED:   Tawni Millers, MD  [facility] 03/03/2019, 4:13 PM

## 2019-03-03 NOTE — Anesthesia Postprocedure Evaluation (Signed)
Anesthesia Post Note  Patient: Wendy Zimmerman  Procedure(s) Performed: ENDOSCOPIC RETROGRADE CHOLANGIOPANCREATOGRAPHY (ERCP) (N/A ) SPHINCTEROTOMY     Patient location during evaluation: Endoscopy Anesthesia Type: General Level of consciousness: sedated Pain management: pain level controlled Vital Signs Assessment: post-procedure vital signs reviewed and stable Respiratory status: spontaneous breathing Cardiovascular status: stable Postop Assessment: no apparent nausea or vomiting Anesthetic complications: no    Last Vitals:  Vitals:   03/03/19 1102 03/03/19 1115  BP: (!) 141/70 (!) 141/77  Pulse: 78 66  Resp: 19 12  Temp: 36.6 C   SpO2: 100% 94%    Last Pain:  Vitals:   03/03/19 1115  TempSrc:   PainSc: 0-No pain   Pain Goal: Patients Stated Pain Goal: 4 (03/03/19 0833)                 Huston Foley

## 2019-03-03 NOTE — Anesthesia Preprocedure Evaluation (Addendum)
Anesthesia Evaluation  Patient identified by MRN, date of birth, ID band Patient awake    Reviewed: Allergy & Precautions, NPO status , Patient's Chart, lab work & pertinent test results  History of Anesthesia Complications Negative for: history of anesthetic complications  Airway Mallampati: II  TM Distance: >3 FB Neck ROM: Full    Dental no notable dental hx.    Pulmonary neg pulmonary ROS,    Pulmonary exam normal        Cardiovascular hypertension, Pt. on medications Normal cardiovascular exam     Neuro/Psych Depression negative neurological ROS     GI/Hepatic Neg liver ROS, GERD  Medicated and Controlled,Cholecystitis   Endo/Other  Morbid obesity  Renal/GU negative Renal ROS     Musculoskeletal  (+) Arthritis ,   Abdominal   Peds  Hematology negative hematology ROS (+)   Anesthesia Other Findings Day of surgery medications reviewed with the patient.  Reproductive/Obstetrics                            Anesthesia Physical Anesthesia Plan  ASA: III  Anesthesia Plan: General   Post-op Pain Management:    Induction: Intravenous, Rapid sequence and Cricoid pressure planned  PONV Risk Score and Plan: 4 or greater and Treatment may vary due to age or medical condition, Ondansetron, Dexamethasone and Midazolam  Airway Management Planned: Oral ETT  Additional Equipment:   Intra-op Plan:   Post-operative Plan: Extubation in OR  Informed Consent: I have reviewed the patients History and Physical, chart, labs and discussed the procedure including the risks, benefits and alternatives for the proposed anesthesia with the patient or authorized representative who has indicated his/her understanding and acceptance.     Dental advisory given  Plan Discussed with: CRNA  Anesthesia Plan Comments:        Anesthesia Quick Evaluation

## 2019-03-03 NOTE — Discharge Instructions (Signed)
LAPAROSCOPIC SURGERY: POST OP INSTRUCTIONS ° °###################################################################### ° °EAT °Gradually transition to a high fiber diet with a fiber supplement over the next few weeks after discharge.  Start with a pureed / full liquid diet (see below) ° °WALK °Walk an hour a day.  Control your pain to do that.   ° °CONTROL PAIN °Control pain so that you can walk, sleep, tolerate sneezing/coughing, go up/down stairs. ° °HAVE A BOWEL MOVEMENT DAILY °Keep your bowels regular to avoid problems.  OK to try a laxative to override constipation.  OK to use an antidairrheal to slow down diarrhea.  Call if not better after 2 tries ° °CALL IF YOU HAVE PROBLEMS/CONCERNS °Call if you are still struggling despite following these instructions. °Call if you have concerns not answered by these instructions ° °###################################################################### ° ° ° °1. DIET: Follow a light bland diet the first 24 hours after arrival home, such as soup, liquids, crackers, etc.  Be sure to include lots of fluids daily.  Avoid fast food or heavy meals as your are more likely to get nauseated.  Eat a low fat the next few days after surgery.   ° °2. Take your usually prescribed home medications unless otherwise directed. ° °3. PAIN CONTROL: °a. Pain is best controlled by a usual combination of three different methods TOGETHER: °i. Ice/Heat °ii. Over the counter pain medication °iii. Prescription pain medication °b. Most patients will experience some swelling and bruising around the incisions.  Ice packs or heating pads (30-60 minutes up to 6 times a day) will help. Use ice for the first few days to help decrease swelling and bruising, then switch to heat to help relax tight/sore spots and speed recovery.  Some people prefer to use ice alone, heat alone, alternating between ice & heat.  Experiment to what works for you.  Swelling and bruising can take several weeks to resolve.   °c. It  is helpful to take an over-the-counter pain medication regularly for the first few weeks.  Choose one of the following that works best for you: °i. Naproxen (Aleve, etc)  Two 220mg tabs twice a day °ii. Ibuprofen (Advil, etc) Three 200mg tabs four times a day (every meal & bedtime) °iii. Acetaminophen (Tylenol, etc) 500-650mg four times a day (every meal & bedtime) °d. A  prescription for pain medication (such as oxycodone, hydrocodone, tramadol, gabapentin, methocarbamol, etc) should be given to you upon discharge.  Take your pain medication as prescribed.  °i. If you are having problems/concerns with the prescription medicine (does not control pain, nausea, vomiting, rash, itching, etc), please call us [phone] to see if we need to switch you to a different pain medicine that will work better for you and/or control your side effect better. °ii. If you need a refill on your pain medication, please give us 48 hour notice.  contact your pharmacy.  They will contact our office to request authorization. Prescriptions will not be filled after 5 pm or on week-ends ° °4. Avoid getting constipated.   °a. Between the surgery and the pain medications, it is common to experience some constipation.   °b. Increasing fluid intake and taking a fiber supplement (such as Metamucil, Citrucel, FiberCon, MiraLax, etc) 1-2 times a day regularly will usually help prevent this problem from occurring.   °c. A mild laxative (prune juice, Milk of Magnesia, MiraLax, etc) should be taken according to package directions if there are no bowel movements after 48 hours.   °5. Watch out for   diarrhea.   a. If you have many loose bowel movements, simplify your diet to bland foods & liquids for a few days.   b. Stop any stool softeners and decrease your fiber supplement.   c. Switching to mild anti-diarrheal medications (Kayopectate, Pepto Bismol) can help.   d. If this worsens or does not improve, please call us.  6. Wash / shower every  day.  You may shower over the dressings as they are waterproof.  Continue to shower over incision(s) after the dressing is off.  7. Remove your waterproof bandages 5 days after surgery.  You may leave the incision open to air.  You may replace a dressing/Band-Aid to cover the incision for comfort if you wish.   8. ACTIVITIES as tolerated:   a. You may resume regular (light) daily activities beginning the next day--such as daily self-care, walking, climbing stairs--gradually increasing activities as tolerated.  If you can walk 30 minutes without difficulty, it is safe to try more intense activity such as jogging, treadmill, bicycling, low-impact aerobics, swimming, etc. b. Save the most intensive and strenuous activity for last such as sit-ups, heavy lifting, contact sports, etc  Refrain from any heavy lifting or straining until you are off narcotics for pain control.   c. DO NOT PUSH THROUGH PAIN.  Let pain be your guide: If it hurts to do something, don't do it.  Pain is your body warning you to avoid that activity for another week until the pain goes down. d. You may drive when you are no longer taking prescription pain medication, you can comfortably wear a seatbelt, and you can safely maneuver your car and apply brakes. e. Dennis Bast may have sexual intercourse when it is comfortable.  9. FOLLOW UP in our office a. Please call [facility] at [phone] to set up an appointment to see your surgeon in the office for a follow-up appointment approximately 2-3 weeks after your surgery. b. Make sure that you call for this appointment the day you arrive home to insure a convenient appointment time.  10. IF YOU HAVE DISABILITY OR FAMILY LEAVE FORMS, BRING THEM TO THE OFFICE FOR PROCESSING.  DO NOT GIVE THEM TO YOUR DOCTOR.   WHEN TO CALL us [phone]: 1. Poor pain control 2. Reactions / problems with new medications (rash/itching, nausea, etc)  3. Fever over 101.5 F (38.5 C) 4. Inability to  urinate 5. Nausea and/or vomiting 6. Worsening swelling or bruising 7. Continued bleeding from incision. 8. Increased pain, redness, or drainage from the incision   The clinic staff is available to answer your questions during regular business hours (8:30am-5pm).  Please dont hesitate to call and ask to speak to one of our nurses for clinical concerns.   If you have a medical emergency, go to the nearest emergency room or call 911.  A surgeon from [facility] is always on call at the [facility], [place], [place], [place], [place]  [address] ? MAIN: [phone] ? TOLL FREE: [phone] ?  FAX [phone] [email or web address]    Cholangitis  Cholangitis is inflammation of the group of tubes (ducts) that carry digestive juices from the liver, gallbladder, and pancreas to the small intestine. This group of ducts is called the biliary tract. Cholangitis can cause fever, abdominal pain, and yellowish discoloration of the skin, the whites of the eyes, and mucous membranes (jaundice). Cholangitis can get worse very quickly and cause infection throughout the body (  sepsis). It is important to diagnose and treat cholangitis as soon as possible. What are the causes? This condition is usually caused by a blockage (obstruction) in the biliary tract. The most common causes of obstruction are:  Formation of hard particles (stones) in the biliary tract.  Damage to the biliary tract from a previous surgical or diagnostic procedure. Other causes of an obstruction include:  Cysts or tumors in the biliary tract.  A type of liver disease that affects the biliary tract (primary sclerosing cholangitis).  Being born with a narrow biliary tract. When the flow of digestive juices is blocked, bacteria that normally live in the intestine can grow and spread inside the biliary tract. What increases the risk? The following factors may  make you more likely to develop this condition:  Being 47?60 years old.  Having a history of stones in the biliary tract.  Having had cholangitis in the past.  Having HIV.  Having another condition that affects the biliary tract.  Having had a procedure to diagnose or treat problems with the biliary tract, especially endoscopic retrograde cholangiopancreatography (ERCP). These types of procedures may cause scarring and obstruction that can lead to infection. What are the signs or symptoms? The most common symptoms of this condition are fever, abdominal pain, and jaundice. Often, all of these symptoms are present. Other symptoms may include:  Chills.  Tiredness.  Nausea.  Dark-colored urine.  Clay-colored stools.  Confusion.  Itchy skin. How is this diagnosed? This condition may be diagnosed based on:  Your symptoms.  A physical exam.  Your medical history. Your health care provider may ask whether you have had stones, ERCP, or other procedures involving the biliary tract in the past.  Blood tests.  Imaging studies, such as: ? An ultrasound. This uses sound waves to make an image of any obstructions that you have. ? An MRI. ? A CT scan.  ERCP to check the biliary tract for possible causes of cholangitis. During ERCP, a thin, lighted tube (endoscope) is passed through your mouth and down your throat into the first part of your small intestine (duodenum). A small, plastic tube (cannula) is then passed through the endoscope and directed into your bile duct or pancreatic duct. Dye is then injected through the cannula and X-rays are taken. How is this treated? This condition is usually treated at a hospital. Treatment may include:  Receiving fluids, nutrition, and antibiotic medicines through an IV line. You may be given antibiotics that kill most of the bacteria known to cause cholangitis (broad spectrumantibiotics).  ERCP or another surgical procedure to open and drain  the biliary tract. Follow these instructions at home: Medicines  Take over-the-counter and prescription medicines only as told by your health care provider.  Take your antibiotic medicine as told by your health care provider. Do not stop taking the antibiotic even if you start to feel better. General instructions  Follow instructions from your health care provider about eating or drinking restrictions.  Maintain a healthy weight.  Keep all follow-up visits as told by your health care provider. This is important. Activity  Exercise regularly, as told by your health care provider.  Return to your normal activities as told by your health care provider. Ask your health care provider what activities are safe for you. Contact a health care provider if you:  Have symptoms that return or become more severe.  Suddenly lose weight. Get help right away if you:  Have a fever.  Have chills.  Have severe abdominal pain.  Feel dizzy or lightheaded. Summary  Cholangitis is inflammation of the group of tubes (ducts) that carry digestive juices from the liver, gallbladder, and pancreas to the small intestine.  This condition is usually caused by a blockage (obstruction) in the biliary tract.  The most common symptoms of this condition are fever, abdominal pain, and jaundice.  This condition is usually treated at a hospital. This information is not intended to replace advice given to you by your health care provider. Make sure you discuss any questions you have with your health care provider. Document Released: 10/08/2015 Document Revised: 05/16/2018 Document Reviewed: 05/16/2018 Elsevier Interactive Patient Education  2019 [employer].

## 2019-03-03 NOTE — Anesthesia Postprocedure Evaluation (Signed)
Anesthesia Post Note  Patient: Meaghann P Dutson  Procedure(s) Performed: LAPAROSCOPIC CHOLECYSTECTOMY SINGLE SITE WITH INTRA OPERATIVE CHOLANGIOGRAM AND REPAIR OF UMBILICAL HERNIA (N/A )     Patient location during evaluation: PACU Anesthesia Type: General Level of consciousness: awake and alert Pain management: pain level controlled Vital Signs Assessment: post-procedure vital signs reviewed and stable Respiratory status: spontaneous breathing, nonlabored ventilation and respiratory function stable Cardiovascular status: blood pressure returned to baseline and stable Postop Assessment: no apparent nausea or vomiting Anesthetic complications: no    Last Vitals:  Vitals:   03/03/19 1310 03/03/19 1416  BP: (!) 149/84 139/88  Pulse: 69 72  Resp: 16 13  Temp: 36.6 C 36.7 C  SpO2: 94% 96%    Last Pain:  Vitals:   03/03/19 1455  TempSrc:   PainSc: 0-No pain                 Brennan Bailey

## 2019-03-04 ENCOUNTER — Encounter (HOSPITAL_COMMUNITY): Payer: Self-pay | Admitting: Surgery

## 2019-04-02 ENCOUNTER — Other Ambulatory Visit: Payer: Self-pay | Admitting: Family Medicine

## 2019-04-02 DIAGNOSIS — Z1231 Encounter for screening mammogram for malignant neoplasm of breast: Secondary | ICD-10-CM

## 2019-05-16 ENCOUNTER — Other Ambulatory Visit: Payer: Self-pay

## 2019-05-16 ENCOUNTER — Ambulatory Visit
Admission: RE | Admit: 2019-05-16 | Discharge: 2019-05-16 | Disposition: A | Discharge status: Home / Self Care | Payer: 59 | Source: Ambulatory Visit | Attending: Family Medicine | Admitting: Family Medicine

## 2019-05-16 DIAGNOSIS — Z1231 Encounter for screening mammogram for malignant neoplasm of breast: Secondary | ICD-10-CM

## 2019-09-05 ENCOUNTER — Other Ambulatory Visit: Payer: Self-pay

## 2019-09-05 DIAGNOSIS — Z20822 Contact with and (suspected) exposure to covid-19: Secondary | ICD-10-CM

## 2019-09-07 LAB — NOVEL CORONAVIRUS, NAA: SARS-CoV-2, NAA: NOT DETECTED

## 2020-01-28 IMAGING — CT CT CHEST W/O CM
3 of 4 series · 16 of 30 positions shown, 18 images · IV contrast (iopamidol)
Comparison: Chest x-ray 10/22/2017

CLINICAL DATA: Right flank pain for 2 weeks. Possible pneumonia on
recent chest x-ray.

EXAM:
CT CHEST WITHOUT CONTRAST, ABDOMEN AND PELVIS WITHOUT AND WITH
CONTRAST
TECHNIQUE: Multidetector CT imaging of the chest, abdomen and pelvis was
performed following the standard protocol before and during bolus
administration of intravenous contrast.
CONTRAST:  125mL 9OXPML-5HH IOPAMIDOL (9OXPML-5HH) INJECTION 61%

[Series 3: chest w/o · axial · non-contrast · 0.70mm/px · z∈[-165,+10]mm · 6 of 100 slices shown, 8 images]
[im 15/100  mediastinal]
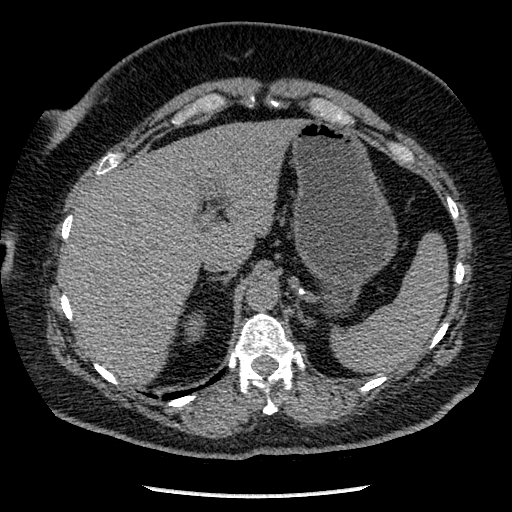
[im 15/100  lung]
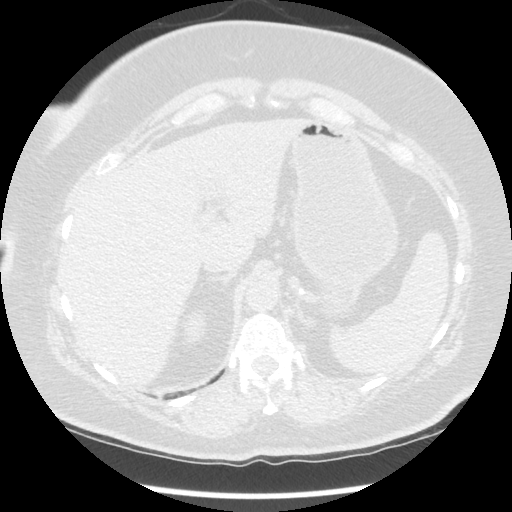
[im 29/100  lung]
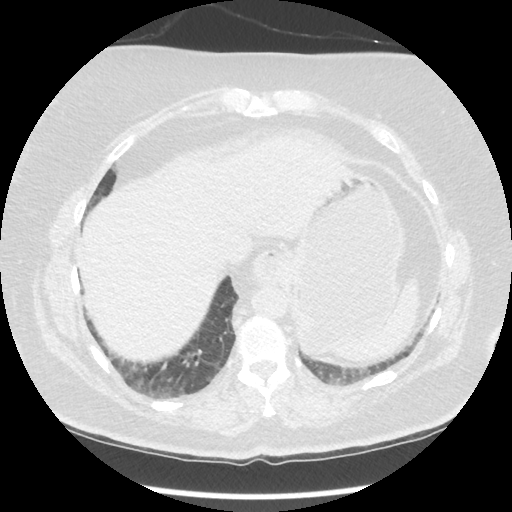
[im 43/100  lung]
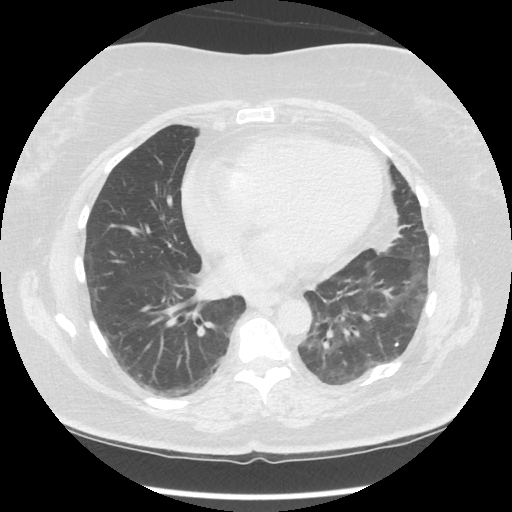
[im 57/100  lung]
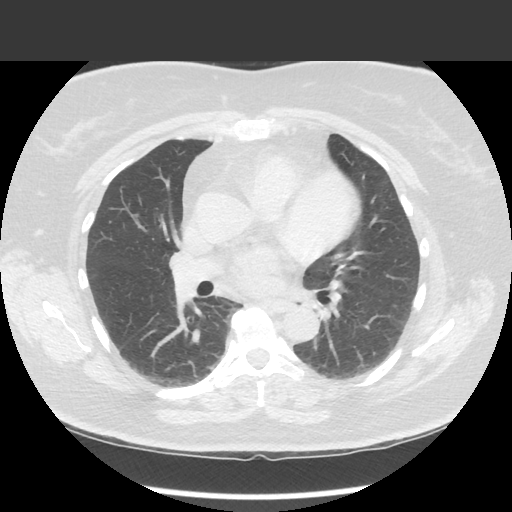
[im 71/100  mediastinal]
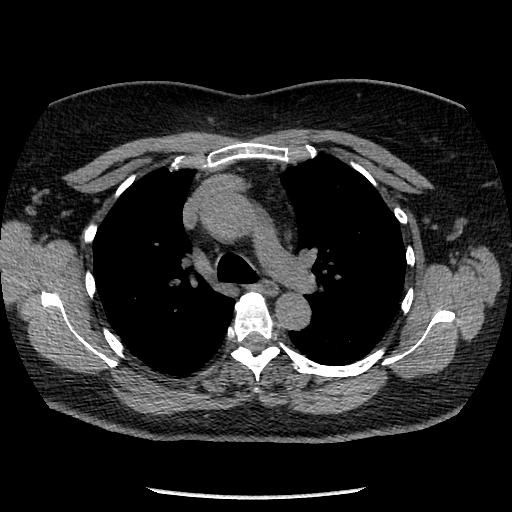
[im 71/100  lung]
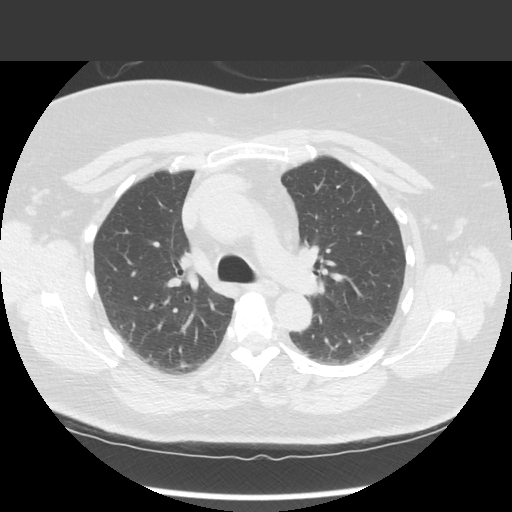
[im 85/100  lung]
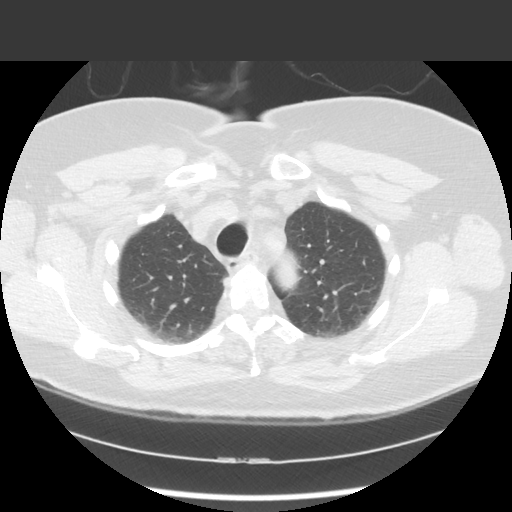

[Series 4: lung windows · axial · 0.70mm/px · z∈[-165,+10]mm · 6 of 100 slices shown]
[im 15/100  lung]
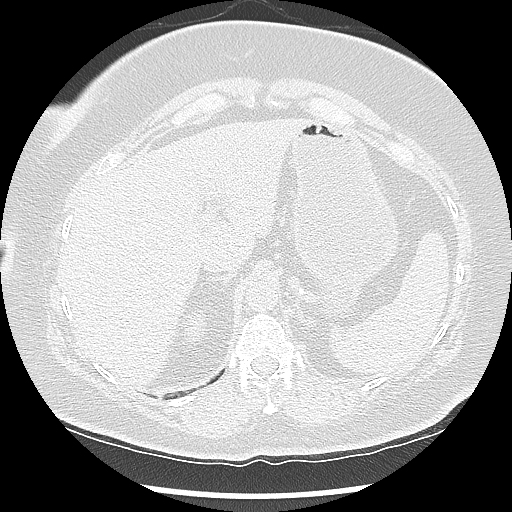
[im 29/100  lung]
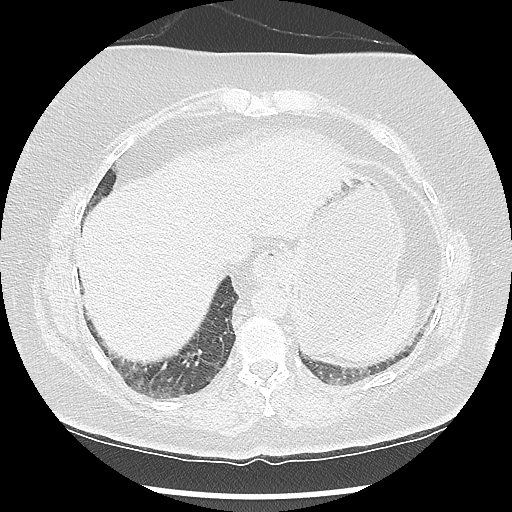
[im 43/100  lung]
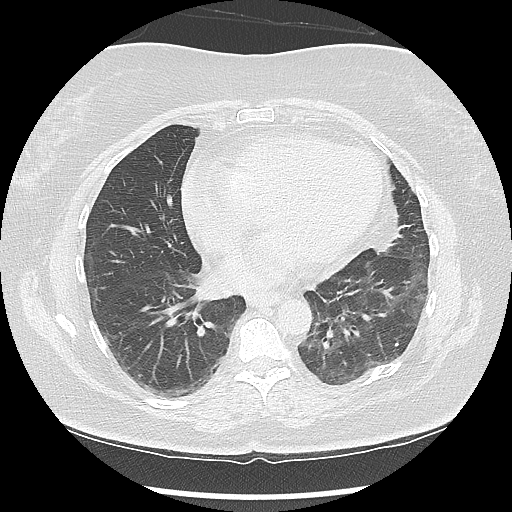
[im 57/100  lung]
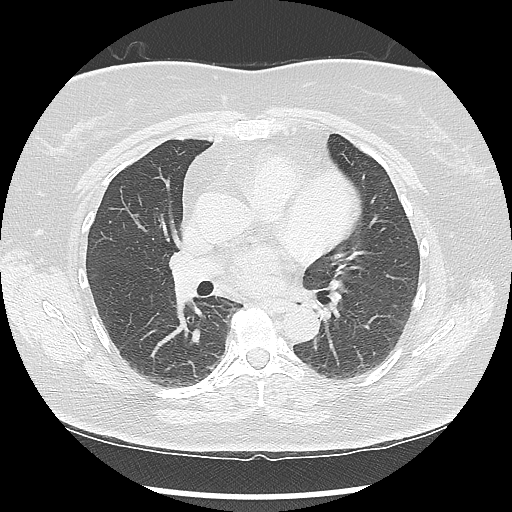
[im 71/100  lung]
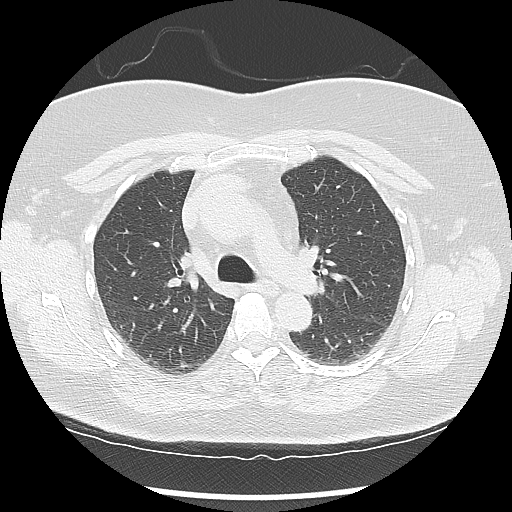
[im 85/100  lung]
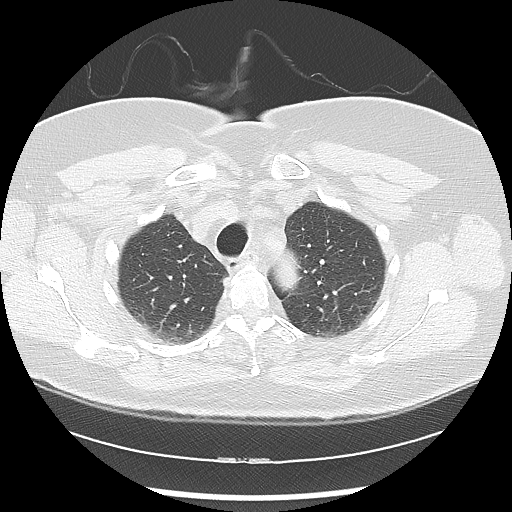

[Series 602: sagittal body · sagittal · 0.70mm/px · 4 of 145 slices shown]
[im 14/145  mediastinal]
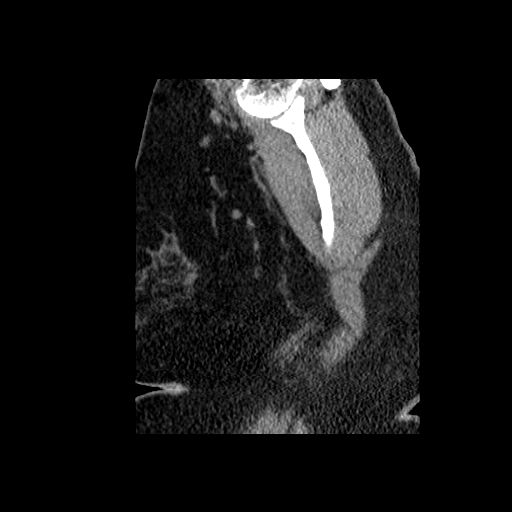
[im 27/145  mediastinal]
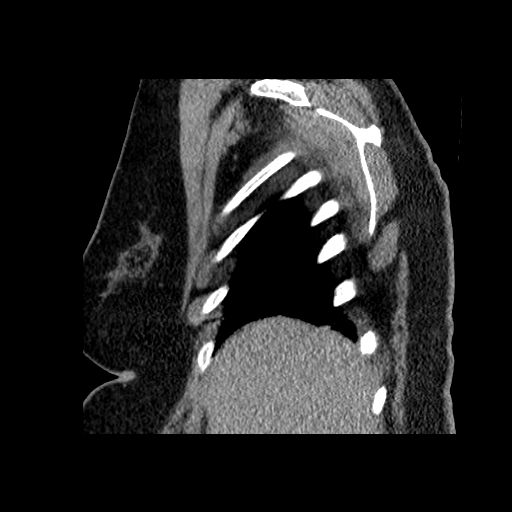
[im 53/145  mediastinal]
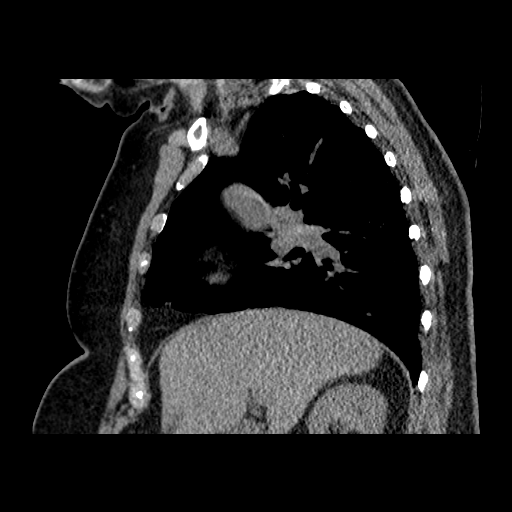
[im 66/145  mediastinal]
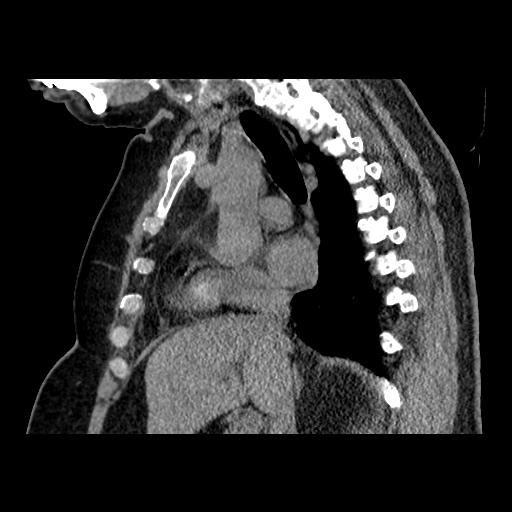

[16 of 30 positions shown; findings below may reference images not displayed]

FINDINGS: CT CHEST FINDINGS

Cardiovascular: The heart is normal in size. No pericardial
effusion. Mild tortuosity of the thoracic aorta but no aneurysm or
significant atherosclerotic calcifications. Coronary artery
calcifications are noted.

Mediastinum/Nodes: No mediastinal or hilar mass or lymphadenopathy.
The esophagus is grossly normal.

Lungs/Pleura: Patchy bibasilar airspace opacity, possible earlier
clearing infiltrates. No effusions. No worrisome pulmonary lesions.

Musculoskeletal: No breast masses, supraclavicular or axillary
lymphadenopathy. No significant bony findings.

CT ABDOMEN PELVIS FINDINGS

Hepatobiliary: No focal hepatic lesions or intrahepatic biliary
dilatation. The gallbladder is normal. No common bile duct
dilatation.

Pancreas: No mass, inflammation or ductal dilatation.

Spleen: Normal size.  No focal lesions.

Adrenals/Urinary Tract: The adrenal glands and kidneys are normal.
The bladder is unremarkable.

Stomach/Bowel: The stomach, duodenum, small bowel and colon are
grossly normal without oral contrast. No inflammatory changes, mass
lesions or obstructive findings. The terminal ileum and appendix are
normal.

Vascular/Lymphatic: Scattered atherosclerotic calcifications
involving the distal aorta and iliac arteries. No aneurysm. The
branch vessels are patent. The major venous structures are patent.
No mesenteric or retroperitoneal mass or adenopathy.

Reproductive: Surgically absent.

Other: Small right inguinal hernia and small abdominal wall hernias.

Musculoskeletal: No significant findings.
IMPRESSION: 1. Patchy bibasilar airspace opacities, likely early or clearing
infiltrates.
2. No acute abdominal/pelvic findings, mass lesions or adenopathy.
No renal, ureteral or bladder calculi or mass.

## 2020-04-05 ENCOUNTER — Other Ambulatory Visit: Payer: Self-pay | Admitting: Family Medicine

## 2020-04-05 DIAGNOSIS — Z1231 Encounter for screening mammogram for malignant neoplasm of breast: Secondary | ICD-10-CM

## 2020-05-21 ENCOUNTER — Other Ambulatory Visit: Payer: Self-pay

## 2020-05-21 ENCOUNTER — Ambulatory Visit
Admission: RE | Admit: 2020-05-21 | Discharge: 2020-05-21 | Disposition: A | Discharge status: Home / Self Care | Payer: 59 | Source: Ambulatory Visit | Attending: Family Medicine | Admitting: Family Medicine

## 2020-05-21 DIAGNOSIS — Z1231 Encounter for screening mammogram for malignant neoplasm of breast: Secondary | ICD-10-CM

## 2020-12-31 ENCOUNTER — Encounter: Payer: Self-pay | Admitting: Internal Medicine

## 2020-12-31 ENCOUNTER — Ambulatory Visit: Payer: 59 | Admitting: Internal Medicine

## 2020-12-31 ENCOUNTER — Other Ambulatory Visit: Payer: Self-pay

## 2020-12-31 VITALS — BP 96/58 | HR 72 | Ht 64.0 in | Wt 228.4 lb

## 2020-12-31 DIAGNOSIS — R197 Diarrhea, unspecified: Secondary | ICD-10-CM | POA: Diagnosis not present

## 2020-12-31 DIAGNOSIS — I10 Essential (primary) hypertension: Secondary | ICD-10-CM

## 2020-12-31 DIAGNOSIS — I700 Atherosclerosis of aorta: Secondary | ICD-10-CM | POA: Insufficient documentation

## 2020-12-31 DIAGNOSIS — R072 Precordial pain: Secondary | ICD-10-CM | POA: Diagnosis not present

## 2020-12-31 DIAGNOSIS — R55 Syncope and collapse: Secondary | ICD-10-CM

## 2020-12-31 DIAGNOSIS — Z8249 Family history of ischemic heart disease and other diseases of the circulatory system: Secondary | ICD-10-CM

## 2020-12-31 MED ORDER — IVABRADINE HCL 5 MG PO TABS: ORAL_TABLET | ORAL | 0 refills | Status: DC

## 2020-12-31 NOTE — Patient Instructions (Addendum)
Medication Instructions: Your physician recommends that you continue on your current medications as directed. Please refer to the Current Medication list given to you today.  *If you need a refill on your cardiac medications before your next appointment, please call your pharmacy*   Lab Work: NONE If you have labs (blood work) drawn today and your tests are completely normal, you will receive your results only by: Marland Kitchen MyChart Message (if you have MyChart) OR . A paper copy in the mail If you have any lab test that is abnormal or we need to change your treatment, we will call you to review the results.   Testing/Procedures: Your physician has requested that you have cardiac CT. Cardiac computed tomography (CT) is a painless test that uses an x-ray machine to take clear, detailed pictures of your heart.  Please follow instruction sheet as given.     Follow-Up: At [facility], you and your health needs are our priority.  As part of our continuing mission to provide you with exceptional heart care, we have created designated Provider Care Teams.  These Care Teams include your primary Cardiologist (physician) and Advanced Practice Providers (APPs -  Physician Assistants and Nurse Practitioners) who all work together to provide you with the care you need, when you need it.  We recommend signing up for the patient portal called "MyChart".  Sign up information is provided on this After Visit Summary.  MyChart is used to connect with patients for Virtual Visits (Telemedicine).  Patients are able to view lab/test results, encounter notes, upcoming appointments, etc.  Non-urgent messages can be sent to your provider as well.   To learn more about what you can do with MyChart, go to [email or web address].    Your next appointment:   4-5 month(s)  The format for your next appointment:   In Person  Provider:   You may see Rudean Haskell, MD or one of the following Advanced Practice  Providers on your designated Care Team:    Melina Copa, PA-C  Ermalinda Barrios, PA-C    Other Instructions  Your cardiac CT will be scheduled at one of the below locations:   [facility] [address] [place], [place] [address] [phone]  [place] [address] [place], [place] [address] [phone]  If scheduled at [facility], please arrive at the [facility] main entrance (entrance A) of [facility] 30 minutes prior to test start time. Proceed to the [facility] Radiology Department (first floor) to check-in and test prep.  If scheduled at [facility], please arrive 15 mins early for check-in and test prep.  Please follow these instructions carefully (unless otherwise directed):   On the Night Before the Test: . Be sure to Drink plenty of water. . Do not consume any caffeinated/decaffeinated beverages or chocolate 12 hours prior to your test. . Do not take any antihistamines 12 hours prior to your test.   On the Day of the Test: . Drink plenty of water until 1 hour prior to the test. . Do not eat any food 4 hours prior to the test. . You may take your regular medications prior to the test.  . Take Corlanor 5mg   two hours prior to test. . FEMALES- please wear underwire-free bra if available       After the Test: . Drink plenty of water. . After receiving IV contrast, you may experience a mild flushed feeling.  This is normal. . On occasion, you may experience a mild rash up to 24 hours after the test. This is not dangerous. If this occurs, you can take Benadryl 25 mg and increase your fluid intake. . If you experience trouble breathing, this can be serious. If it is severe call 911 IMMEDIATELY. If it is mild, please call our office. . If you take any of these medications: Glipizide/Metformin, Avandament, Glucavance, please do not take 48 hours after  completing test unless otherwise instructed.   Once we have confirmed authorization from your insurance company, we will call you to set up a date and time for your test. Based on how quickly your insurance processes prior authorizations requests, please allow up to 4 weeks to be contacted for scheduling your Cardiac CT appointment. Be advised that routine Cardiac CT appointments could be scheduled as many as 8 weeks after your provider has ordered it.  For non-scheduling related questions, please contact the cardiac imaging nurse navigator should you have any questions/concerns: Marchia Bond, Cardiac Imaging Nurse Navigator Gordy Clement, Cardiac Imaging Nurse Navigator [facility] Direct Office Dial: [phone]   For scheduling needs, including cancellations and rescheduling, please call [place], [phone].

## 2020-12-31 NOTE — Progress Notes (Signed)
Cardiology Office Note:    Date:  12/31/2020   ID:  Wendy Zimmerman, DOB 1961-01-29, MRN [id number]  PCP:  Kelton Pillar, MD   [place]  Cardiologist:  Werner Lean, MD  Advanced Practice Provider:  No care team member to display Electrophysiologist:  None       CC: CP and near syncope Consulted for the evaluation of CP Eval at the behest of Kelton Pillar, MD   History of Present Illness:    Wendy Zimmerman is a 60 y.o. female with a hx of HTN, HLD Aortic Atherosclerosis, early family history of CAD who presents for evaluation 12/31/20  Patient notes that she is feeling OK and has not had chest pain since Tuesday. Had a Sewage leak the Saturday before and is under a lot of stress. Has also had new diarrhea, coupled with and increase in her Diovan.  Notes that she has had a lot of coughing around Tuesday (12/28/20) and then felt a pull in her chest.  Accompanied by dizziness.  Chest pain resolved but had near syncope. Resolved prior to aspiring administration.   EMS came and had EKG (reviwed strips SR with borderline infarct pattern anterior). Went to see PCP 12/30/20 and has resolution of CP, without SOB, and with normal ECG (old records obtained but I do not have the EKG) done.  Given her chest pain, near syncope, and prior LAD calcifications, with early family history of CAD advised for cardiology eval.  Ambulatory BP not done.   Past Medical History:  Diagnosis Date  . Bowen disease   . HTN (hypertension)   . Hyperlipidemia   . Osteoarthritis     Past Surgical History:  Procedure Laterality Date  . CATARACT EXTRACTION    . ERCP N/A 02/28/2019   Procedure: ENDOSCOPIC RETROGRADE CHOLANGIOPANCREATOGRAPHY (ERCP);  Surgeon: Clarene Essex, MD;  Location: [facility];  Service: Endoscopy;  Laterality: N/A;  . HAND SURGERY    . LAPAROSCOPIC CHOLECYSTECTOMY SINGLE PORT N/A 03/03/2019   Procedure: LAPAROSCOPIC CHOLECYSTECTOMY SINGLE SITE WITH INTRA OPERATIVE  CHOLANGIOGRAM AND REPAIR OF UMBILICAL HERNIA;  Surgeon: Michael Boston, MD;  Location: [facility];  Service: General;  Laterality: N/A;  . ROBOTIC ASSISTED TOTAL HYSTERECTOMY  07/14/2008   Dr Joan Flores  . SPHINCTEROTOMY  02/28/2019   Procedure: SPHINCTEROTOMY;  Surgeon: Clarene Essex, MD;  Location: [facility];  Service: Endoscopy;;  balloon sweep    Current Medications: Current Meds  Medication Sig  . aspirin EC 81 MG tablet Take 81 mg by mouth daily.  Marland Kitchen atorvastatin (LIPITOR) 20 MG tablet Take 20 mg by mouth 3 (three) times a week.  . cycloSPORINE (RESTASIS) 0.05 % ophthalmic emulsion Place 1 drop into both eyes 2 (two) times daily.  Marland Kitchen ibuprofen (ADVIL) 200 MG tablet You can take 2-3 tablets every 6 hours for pain not relieved by plain Tylenol/Acetaminophen.  You can start ibuprofen about 2 hours after you take your Tylenol/acetaminophen.  Alternate these as needed for pain relief.  You can buy this over the counter at any drug store.  . ivabradine (CORLANOR) 5 MG TABS tablet Take 2 hours prior to Cardiac CT  . Multiple Vitamin (MULTIVITAMIN WITH MINERALS) TABS tablet Take 1 tablet by mouth daily.  . valsartan-hydrochlorothiazide (DIOVAN-HCT) 320-25 MG tablet Take 1 tablet by mouth daily.     Allergies:   Ciprofloxacin and Lisinopril   Social History   Socioeconomic History  . Marital status: Divorced    Spouse name: Not on file  .  Number of children: Not on file  . Years of education: Not on file  . Highest education level: Not on file  Occupational History  . Not on file  Tobacco Use  . Smoking status: Never Smoker  . Smokeless tobacco: Never Used  Substance and Sexual Activity  . Alcohol use: Never  . Drug use: Never  . Sexual activity: Not on file  Other Topics Concern  . Not on file  Social History Narrative  . Not on file   Social Determinants of Health   Financial Resource Strain: Not on file  Food Insecurity: Not on file  Transportation Needs: Not on file  Physical  Activity: Not on file  Stress: Not on file  Social Connections: Not on file     Family History: The patient's family history includes CAD in an other family member; Cancer in an other family member; Hypertension in an other family member.  History of coronary artery disease notable for sister had MI at 63, father had MI. History of heart failure notable for father possible. History of arrhythmia notable for dad with AF.  ROS:   Please see the history of present illness.     All other systems reviewed and are negative.  EKGs/Labs/Other Studies Reviewed:    The following studies were reviewed today:  EKG:   12/28/20: SR borderline ant infarct 80  NonCardiac CT: Date: 10/22/2017 Results: LAD Calcium Aortic Atherosclerosis  Recent Labs: No results found for requested labs within last 8760 hours.  Recent Lipid Panel No results found for: CHOL, TRIG, HDL, CHOLHDL, VLDL, LDLCALC, LDLDIRECT   Risk Assessment/Calculations:     N/A  Physical Exam:    VS:  BP (!) 96/58   Pulse 72   Ht 5\' 4"  (1.626 m)   Wt 228 lb 6.4 oz (103.6 kg)   SpO2 96%   BMI 39.20 kg/m     Wt Readings from Last 3 Encounters:  12/31/20 228 lb 6.4 oz (103.6 kg)  03/03/19 195 lb (88.5 kg)    GEN: Obese Female well developed in no acute distress HEENT: Normal NECK: No JVD; No carotid bruits LYMPHATICS: No lymphadenopathy CARDIAC: RRR, no murmurs, rubs, gallops RESPIRATORY:  Clear to auscultation without rales, wheezing or rhonchi  ABDOMEN: Soft, non-tender, non-distended MUSCULOSKELETAL:  No edema; No deformity  SKIN: Warm and dry NEUROLOGIC:  Alert and oriented x 3 PSYCHIATRIC:  Normal affect   ASSESSMENT:    1. Aortic atherosclerosis ([place])   2. Family history of premature CAD   3. Precordial chest pain   4. Diarrhea, unspecified type   5. Primary hypertension   6. Near syncope   7. Morbid obesity (HCC)    PLAN:    In order of problems listed above:  Chest Pain Syndrome  Aortic  Atherosclerosis with LAD Calcium Near syncope Morbid Obesity with HTN Premature family history of CAD - starting ambulatory BP monitoring and advised patient to hold Diovan; concern diuretic and diarrhea lead to vasovagal syncope - The patient presents with possibly cardiac pain  - Would recommend CCTA with possible FFR as needed to exclude obstructive CAD and to assess for non-obstructive CAD requiring secondary prevention - patient to call in if BP elevates and to start Diovan - Reviewed Non Con CT with patient - based on results, will increase therapy for primary prevention  Summer follow up unless new symptoms or abnormal test results warranting change in plan  Would be reasonable for  APP Follow up  Medication Adjustments/Labs and Tests Ordered: Current medicines are reviewed at length with the patient today.  Concerns regarding medicines are outlined above.  Orders Placed This Encounter  Procedures  . Basic metabolic panel   Meds ordered this encounter  Medications  . ivabradine (CORLANOR) 5 MG TABS tablet    Sig: Take 2 hours prior to Cardiac CT    Dispense:  1 tablet    Refill:  0    Patient Instructions  Medication Instructions: Your physician recommends that you continue on your current medications as directed. Please refer to the Current Medication list given to you today.  *If you need a refill on your cardiac medications before your next appointment, please call your pharmacy*   Lab Work: NONE If you have labs (blood work) drawn today and your tests are completely normal, you will receive your results only by: Marland Kitchen MyChart Message (if you have MyChart) OR . A paper copy in the mail If you have any lab test that is abnormal or we need to change your treatment, we will call you to review the results.   Testing/Procedures: Your physician has requested that you have cardiac CT. Cardiac computed tomography (CT) is a painless test that uses an x-ray  machine to take clear, detailed pictures of your heart.  Please follow instruction sheet as given.     Follow-Up: At [facility], you and your health needs are our priority.  As part of our continuing mission to provide you with exceptional heart care, we have created designated Provider Care Teams.  These Care Teams include your primary Cardiologist (physician) and Advanced Practice Providers (APPs -  Physician Assistants and Nurse Practitioners) who all work together to provide you with the care you need, when you need it.  We recommend signing up for the patient portal called "MyChart".  Sign up information is provided on this After Visit Summary.  MyChart is used to connect with patients for Virtual Visits (Telemedicine).  Patients are able to view lab/test results, encounter notes, upcoming appointments, etc.  Non-urgent messages can be sent to your provider as well.   To learn more about what you can do with MyChart, go to [email or web address].    Your next appointment:   4-5 month(s)  The format for your next appointment:   In Person  Provider:   You may see Rudean Haskell, MD or one of the following Advanced Practice Providers on your designated Care Team:    Melina Copa, PA-C  Ermalinda Barrios, PA-C    Other Instructions  Your cardiac CT will be scheduled at one of the below locations:   [facility] [address] [place], [place] [address] [phone]  [place] [address] [place], [place] [address] [phone]  If scheduled at [facility], please arrive at the [facility] main entrance (entrance A) of [facility] 30 minutes prior to test start time. Proceed to the [facility] Radiology Department (first floor) to check-in and test prep.  If scheduled at [facility], please arrive 15 mins early for check-in and test prep.  Please follow these  instructions carefully (unless otherwise directed):   On the Night Before the Test: . Be sure to Drink plenty of water. . Do not consume any caffeinated/decaffeinated beverages or chocolate 12 hours prior to your test. . Do not take any antihistamines 12 hours prior to your test.   On the Day of the  Test: . Drink plenty of water until 1 hour prior to the test. . Do not eat any food 4 hours prior to the test. . You may take your regular medications prior to the test.  . Take Corlanor 5mg   two hours prior to test. . FEMALES- please wear underwire-free bra if available       After the Test: . Drink plenty of water. . After receiving IV contrast, you may experience a mild flushed feeling. This is normal. . On occasion, you may experience a mild rash up to 24 hours after the test. This is not dangerous. If this occurs, you can take Benadryl 25 mg and increase your fluid intake. . If you experience trouble breathing, this can be serious. If it is severe call 911 IMMEDIATELY. If it is mild, please call our office. . If you take any of these medications: Glipizide/Metformin, Avandament, Glucavance, please do not take 48 hours after completing test unless otherwise instructed.   Once we have confirmed authorization from your insurance company, we will call you to set up a date and time for your test. Based on how quickly your insurance processes prior authorizations requests, please allow up to 4 weeks to be contacted for scheduling your Cardiac CT appointment. Be advised that routine Cardiac CT appointments could be scheduled as many as 8 weeks after your provider has ordered it.  For non-scheduling related questions, please contact the cardiac imaging nurse navigator should you have any questions/concerns: Marchia Bond, Cardiac Imaging Nurse Navigator Gordy Clement, Cardiac Imaging Nurse Navigator [facility] Direct Office Dial: [phone]   For scheduling  needs, including cancellations and rescheduling, please call [place], [phone].      Signed, Werner Lean, MD  12/31/2020 11:00 AM    [place]

## 2020-12-31 NOTE — Addendum Note (Signed)
Addended by: Marcelle Overlie D on: 12/31/2020 12:10 PM   Modules accepted: Orders

## 2021-01-03 ENCOUNTER — Telehealth: Payer: Self-pay | Admitting: Internal Medicine

## 2021-01-03 NOTE — Addendum Note (Signed)
Addended by: Precious Gilding on: 01/03/2021 04:16 PM   Modules accepted: Orders

## 2021-01-03 NOTE — Telephone Encounter (Signed)
Patient called and stated that she was supposed to be schedule for a CT scan, but never received a call and CT scan are not in active order. Please call back

## 2021-01-03 NOTE — Telephone Encounter (Signed)
Called patient in regards to Cardiac CT. Message left for her to call the office.   I placed order for cardiac CT ordered during 12/31/20 OV.

## 2021-01-03 NOTE — Telephone Encounter (Signed)
Patient called back, I informed her that order was placed for cardiac CT.  Appointment will not be scheduled until CT is preauthorized by insurance.  She was hoping to get CT scheduled sooner but understands the process of pre-authorization.

## 2021-01-04 ENCOUNTER — Telehealth: Payer: Self-pay

## 2021-01-04 NOTE — Telephone Encounter (Signed)
Referral notes sent from Sandy at [facility], Phone #: [phone], Fax #: [phone]   Notes sent to scheduling

## 2021-01-07 ENCOUNTER — Telehealth (HOSPITAL_COMMUNITY): Payer: Self-pay | Admitting: Emergency Medicine

## 2021-01-07 NOTE — Telephone Encounter (Signed)
Reaching out to patient to offer assistance regarding upcoming cardiac imaging study; pt verbalizes understanding of appt date/time, parking situation and where to check in, pre-test NPO status and medications ordered, and verified current allergies; name and call back number provided for further questions should they arise Marchia Bond RN Navigator Cardiac Imaging [facility] [phone] office [phone] cell  5mg  ivabradine 2 hr prior to scan  Labs in care everywhere, documents, jump to:RESULTS

## 2021-01-07 NOTE — Telephone Encounter (Signed)
Attempted to call patient regarding upcoming cardiac CT appointment. °Left message on voicemail with name and callback number °Adrijana Haros RN Navigator Cardiac Imaging °[facility] °[phone] Office °[phone] Cell ° °

## 2021-01-11 ENCOUNTER — Ambulatory Visit: Payer: 59 | Admitting: Cardiology

## 2021-01-11 ENCOUNTER — Ambulatory Visit (HOSPITAL_COMMUNITY)
Admission: RE | Admit: 2021-01-11 | Discharge: 2021-01-11 | Disposition: A | Discharge status: Home / Self Care | Payer: 59 | Source: Ambulatory Visit | Attending: Internal Medicine | Admitting: Internal Medicine

## 2021-01-11 ENCOUNTER — Encounter: Payer: 59 | Admitting: *Deleted

## 2021-01-11 ENCOUNTER — Other Ambulatory Visit: Payer: Self-pay

## 2021-01-11 ENCOUNTER — Encounter (HOSPITAL_COMMUNITY): Payer: Self-pay

## 2021-01-11 DIAGNOSIS — R072 Precordial pain: Secondary | ICD-10-CM

## 2021-01-11 DIAGNOSIS — Z006 Encounter for examination for normal comparison and control in clinical research program: Secondary | ICD-10-CM

## 2021-01-11 MED ORDER — METOPROLOL TARTRATE 5 MG/5ML IV SOLN
INTRAVENOUS | Status: AC
  Filled 2021-01-11: qty 20

## 2021-01-11 MED ORDER — NITROGLYCERIN 0.4 MG SL SUBL
0.8000 mg | SUBLINGUAL_TABLET | Freq: Once | SUBLINGUAL | Status: AC
  Administered 2021-01-11: 0.8 mg via SUBLINGUAL

## 2021-01-11 MED ORDER — NITROGLYCERIN 0.4 MG SL SUBL
SUBLINGUAL_TABLET | SUBLINGUAL | Status: AC
  Filled 2021-01-11: qty 2

## 2021-01-11 MED ORDER — METOPROLOL TARTRATE 5 MG/5ML IV SOLN
10.0000 mg | INTRAVENOUS | Status: DC | PRN
  Administered 2021-01-11: 10 mg via INTRAVENOUS

## 2021-01-11 MED ORDER — IOHEXOL 350 MG/ML SOLN
95.0000 mL | Freq: Once | INTRAVENOUS | Status: AC | PRN
  Administered 2021-01-11: 95 mL via INTRAVENOUS

## 2021-01-11 NOTE — Research (Signed)
IDENTIFY Informed Consent        Subject Name:   Wendy Zimmerman           Consent signed: 01-11-2021 at 1:35 pm    Subject met inclusion and exclusion criteria.  The informed consent form, study requirements and expectations were reviewed with the subject and questions and concerns were addressed prior to the signing of the consent form.  The subject verbalized understanding of the trial requirements.  The subject agreed to participate in the IDENTIFY trial and signed the informed consent.  The informed consent was obtained prior to performance of any protocol-specific procedures for the subject.  A copy of the signed informed consent was given to the subject and a copy was placed in the subject's medical record.   Leota Jacobsen, BSN, CVRN-BC  Cardioivascular Research Nurse [facility] [phone]

## 2021-01-12 ENCOUNTER — Telehealth: Payer: Self-pay | Admitting: Internal Medicine

## 2021-01-12 DIAGNOSIS — R072 Precordial pain: Secondary | ICD-10-CM | POA: Diagnosis not present

## 2021-01-12 DIAGNOSIS — R931 Abnormal findings on diagnostic imaging of heart and coronary circulation: Secondary | ICD-10-CM | POA: Diagnosis not present

## 2021-01-12 MED ORDER — NITROGLYCERIN 0.4 MG SL SUBL: SUBLINGUAL_TABLET | SUBLINGUAL | 3 refills | Status: DC

## 2021-01-12 MED ORDER — EZETIMIBE 10 MG PO TABS: 10.0000 mg | ORAL_TABLET | Freq: Every day | ORAL | 3 refills | Status: DC

## 2021-01-12 NOTE — Progress Notes (Signed)
Letter sent through Epic requesting results of lab draw from PCP Kelton Pillar, MD.

## 2021-01-12 NOTE — Telephone Encounter (Signed)
    Pt is calling for her CT result, she said a message from Dr. Gasper Sells wants her to call for his recommendations

## 2021-01-12 NOTE — Telephone Encounter (Signed)
-----   Message from Werner Lean, MD sent at 01/12/2021  7:42 AM EDT ----- Results: Moderate non-obstructive disease FFR 0.88 Plan: Will recheck fasting lipids for prevention If blood pressure improved post diarrheal illness can return her diovan and give her PRN nitro  Werner Lean, MD

## 2021-01-12 NOTE — Telephone Encounter (Addendum)
Called patient and reviewed results of Cardiac CT and MD recommendations.  She expressed that she recently had labs drawn at PCP office.  I will request results of lab work from Kelton Pillar, MD.  I informed patient that she needs aggressive treatment to prevent progression of CAD.  She is currently only taking atorvastatin 3 days per week d/t muscle aches in legs. She expresses that she is a picky eater and does not eat fruits and vegetables.  She can't exercise d/t need for knee replacement.  I suggested water aerobics; but she says that she can't find a class that fits her schedule.  I again advised her that she will have to come up with a plan to prevention progression of CAD.  I will route to Dr. Gasper Sells for medication advisement.  I explained how to use PRN SL Nitroglycerin she verbalized understanding.  Atorvastatin taken off pt medication list added Zetia 10 mg PO QD per Dr. Gasper Sells.

## 2021-01-12 NOTE — Addendum Note (Signed)
Addended by: Precious Gilding on: 01/12/2021 03:32 PM   Modules accepted: Orders

## 2021-03-22 ENCOUNTER — Other Ambulatory Visit: Payer: Self-pay | Admitting: Family Medicine

## 2021-03-22 DIAGNOSIS — Z1231 Encounter for screening mammogram for malignant neoplasm of breast: Secondary | ICD-10-CM

## 2021-04-18 MED ORDER — ROSUVASTATIN CALCIUM 10 MG PO TABS: 10.0000 mg | ORAL_TABLET | Freq: Every day | ORAL | 3 refills | Status: DC

## 2021-04-18 NOTE — Telephone Encounter (Signed)
Pt is agreeable to start rosuvastatin 10 mg PO QD. Order placed.

## 2021-04-18 NOTE — Telephone Encounter (Signed)
-----   Message from Werner Lean, MD sent at 04/18/2021  7:56 AM EDT ----- Results: LDL 86, AST 13, ALT 14 in the setting of moderate non-obstructive CAD Plan: Would re-offer statin (rosuvastatin 10 mg PO Daily)  Werner Lean, MD

## 2021-04-25 ENCOUNTER — Telehealth: Payer: Self-pay

## 2021-04-25 DIAGNOSIS — Z006 Encounter for examination for normal comparison and control in clinical research program: Secondary | ICD-10-CM

## 2021-04-25 NOTE — Telephone Encounter (Signed)
I have attempted without success to contact this patient by phone for her Identify 90 day follow up phone call. I left a message for patient to return my phone call with my name and callback number. An e-mail was also sent to patient.  

## 2021-04-29 ENCOUNTER — Ambulatory Visit: Payer: 59 | Admitting: Internal Medicine

## 2021-04-29 ENCOUNTER — Encounter: Payer: Self-pay | Admitting: Internal Medicine

## 2021-04-29 ENCOUNTER — Other Ambulatory Visit: Payer: Self-pay

## 2021-04-29 VITALS — BP 124/98 | HR 76 | Ht 63.0 in | Wt 233.2 lb

## 2021-04-29 DIAGNOSIS — E785 Hyperlipidemia, unspecified: Secondary | ICD-10-CM | POA: Diagnosis not present

## 2021-04-29 DIAGNOSIS — I700 Atherosclerosis of aorta: Secondary | ICD-10-CM

## 2021-04-29 DIAGNOSIS — Z8249 Family history of ischemic heart disease and other diseases of the circulatory system: Secondary | ICD-10-CM

## 2021-04-29 LAB — LIPID PANEL
Chol/HDL Ratio: 2.7 ratio (ref 0.0–4.4)
Cholesterol, Total: 143 mg/dL (ref 100–199)
HDL: 53 mg/dL (ref >39)
LDL Chol Calc (NIH): 73 mg/dL (ref 0–99)
Triglycerides: 88 mg/dL (ref 0–149)
VLDL Cholesterol Cal: 17 mg/dL (ref 5–40)

## 2021-04-29 NOTE — Progress Notes (Signed)
Cardiology Office Note:    Date:  04/29/2021   ID:  Wendy Zimmerman, DOB July 26, 1961, MRN [id number]  PCP:  Kelton Pillar, MD   [place]  Cardiologist:  Werner Lean, MD  Advanced Practice Provider:  No care team member to display Electrophysiologist:  None      CC: CAD follow up  History of Present Illness:    Wendy Zimmerman is a 60 y.o. female with a hx of HTN, HLD Aortic Atherosclerosis, early family history of CAD who presents for evaluation 12/31/20 In interim of this visit, patient had CCTA and was found to have no obstructive disease. Started on rosuvastatin 10 mg Po Daily. Seen 04/29/21.  Patient notes that she is doing fine.  Since last visit notes improvement in her discomfort and stress with resolution of her sewage issues.  Relevant interval testing or therapy include rosuvastatin.  There are no interval hospital/ED visit.    No chest pain or pressure .  No SOB/DOE and no PND/Orthopnea.  No weight gain or leg swelling.  No palpitations or syncope  Did have atorvastatin 20 mg myalgias.  Patient brings all of her supplements in for her view.   Past Medical History:  Diagnosis Date   Bowen disease    HTN (hypertension)    Hyperlipidemia    Osteoarthritis     Past Surgical History:  Procedure Laterality Date   CATARACT EXTRACTION     ERCP N/A 02/28/2019   Procedure: ENDOSCOPIC RETROGRADE CHOLANGIOPANCREATOGRAPHY (ERCP);  Surgeon: Clarene Essex, MD;  Location: [facility];  Service: Endoscopy;  Laterality: N/A;   HAND SURGERY     LAPAROSCOPIC CHOLECYSTECTOMY SINGLE PORT N/A 03/03/2019   Procedure: LAPAROSCOPIC CHOLECYSTECTOMY SINGLE SITE WITH INTRA OPERATIVE CHOLANGIOGRAM AND REPAIR OF UMBILICAL HERNIA;  Surgeon: Michael Boston, MD;  Location: [facility];  Service: General;  Laterality: N/A;   ROBOTIC ASSISTED TOTAL HYSTERECTOMY  07/14/2008   Dr Joan Flores   SPHINCTEROTOMY  02/28/2019   Procedure: SPHINCTEROTOMY;  Surgeon: Clarene Essex, MD;  Location: [facility];  Service: Endoscopy;;  balloon sweep    Current Medications: Current Meds  Medication Sig   aspirin EC 81 MG tablet Take 81 mg by mouth daily.   cycloSPORINE (RESTASIS) 0.05 % ophthalmic emulsion Place 1 drop into both eyes 2 (two) times daily.   diclofenac (VOLTAREN) 75 MG EC tablet Take 75 mg by mouth 2 (two) times daily.   ibuprofen (ADVIL) 200 MG tablet You can take 2-3 tablets every 6 hours for pain not relieved by plain Tylenol/Acetaminophen.  You can start ibuprofen about 2 hours after you take your Tylenol/acetaminophen.  Alternate these as needed for pain relief.  You can buy this over the counter at any drug store.   Multiple Vitamin (MULTIVITAMIN WITH MINERALS) TABS tablet Take 1 tablet by mouth daily.   nitroGLYCERIN (NITROSTAT) 0.4 MG SL tablet Take 1 tablet wait 5 min, if Chest pain continues take 2nd dose, wait 5 min if CP continues take 3rd dose, wait 5 min if CP continues call 911   rosuvastatin (CRESTOR) 10 MG tablet Take 1 tablet (10 mg total) by mouth daily.   valsartan-hydrochlorothiazide (DIOVAN-HCT) 320-25 MG tablet Take 1 tablet by mouth daily.     Allergies:   Ciprofloxacin and Lisinopril   Social History   Socioeconomic History   Marital status: Divorced    Spouse name: Not on file   Number of children: Not on file   Years of education: Not on file  Highest education level: Not on file  Occupational History   Not on file  Tobacco Use   Smoking status: Never   Smokeless tobacco: Never  Substance and Sexual Activity   Alcohol use: Never   Drug use: Never   Sexual activity: Not on file  Other Topics Concern   Not on file  Social History Narrative   Not on file   Social Determinants of Health   Financial Resource Strain: Not on file  Food Insecurity: Not on file  Transportation Needs: Not on file  Physical Activity: Not on file  Stress: Not on file  Social Connections: Not on file     Family History: The patient's family history  includes CAD in an other family member; Cancer in an other family member; Hypertension in an other family member.  History of coronary artery disease notable for sister had MI at 58, father had MI. History of heart failure notable for father possible. History of arrhythmia notable for dad with AF.  ROS:   Please see the history of present illness.     All other systems reviewed and are negative.  EKGs/Labs/Other Studies Reviewed:    The following studies were reviewed today:  EKG:   12/28/20: SR borderline ant infarct 80  NonCardiac CT: Date: 10/22/2017 Results: LAD Calcium Aortic Atherosclerosis  Cardiac CT Date: 01/11/2021 Results: IMPRESSION: 1.  Total calcium score 199 which is 92 nd percentile for age / sex   2. CAD RADS 3 possibly obstructive disease in proximal LAD Study sent for FFR CT   3.  Normal aortic root 3.4 cm   4.  Lipomatous hypertrophy of the atrial septum  IMPRESSION: Abnormal FFR CT in LAD However only approaches obstructive significance in most distal segment at 0.81  Recent Labs: No results found for requested labs within last 8760 hours.  Recent Lipid Panel No results found for: CHOL, TRIG, HDL, CHOLHDL, VLDL, LDLCALC, LDLDIRECT   Risk Assessment/Calculations:     N/A  Physical Exam:    VS:  BP (!) 124/98   Pulse 76   Ht '5\' 3"'$  (1.6 m)   Wt 233 lb 3.2 oz (105.8 kg)   SpO2 95%   BMI 41.31 kg/m     Wt Readings from Last 3 Encounters:  04/29/21 233 lb 3.2 oz (105.8 kg)  12/31/20 228 lb 6.4 oz (103.6 kg)  03/03/19 195 lb (88.5 kg)    GEN: Obese Female well developed in no acute distress HEENT: Normal NECK: No JVD LYMPHATICS: No lymphadenopathy CARDIAC: RRR, no murmurs, rubs, gallops RESPIRATORY:  Clear to auscultation without rales, wheezing or rhonchi  ABDOMEN: Soft, non-tender, non-distended MUSCULOSKELETAL:  No edema; No deformity  SKIN: Warm and dry NEUROLOGIC:  Alert and oriented x 3 PSYCHIATRIC:  Normal affect    ASSESSMENT:    No diagnosis found.  PLAN:    In order of problems listed above:  Moderate non-obstructive coronary artery disease Aortic Atherosclerosis Morbid Obesity with HTN Premature family history of CAD - continue Amb BP - continue ASA, Rosuvastatin 10 mg PO Daily Diovan-HCTZ - based on lipid results today we will consider stopping zetia  Time Spent Directly with Patient:   I have spent a total of 40 minutes with the patient reviewing notes, imaging, EKGs, labs and examining the patient as well as establishing an assessment and plan that was discussed personally with the patient.  > 50% of time was spent in direct patient care; reviewing diet, exercise plan, supplements, and reviewing CT.  Six months follow up unless new symptoms or abnormal test results warranting change in plan  Would be reasonable for  APP Follow up         Medication Adjustments/Labs and Tests Ordered: Current medicines are reviewed at length with the patient today.  Concerns regarding medicines are outlined above.  No orders of the defined types were placed in this encounter.  No orders of the defined types were placed in this encounter.   There are no Patient Instructions on file for this visit.   Signed, Werner Lean, MD  04/29/2021 10:08 AM    [place]

## 2021-04-29 NOTE — Patient Instructions (Addendum)
Medication Instructions:  Your physician recommends that you continue on your current medications as directed. Please refer to the Current Medication list given to you today.  *If you need a refill on your cardiac medications before your next appointment, please call your pharmacy*   Lab Work: Lab work to be done today--Lipid profile If you have labs (blood work) drawn today and your tests are completely normal, you will receive your results only by: [place] (if you have MyChart) OR A paper copy in the mail If you have any lab test that is abnormal or we need to change your treatment, we will call you to review the results.   Testing/Procedures: none   Follow-Up: At [facility], you and your health needs are our priority.  As part of our continuing mission to provide you with exceptional heart care, we have created designated Provider Care Teams.  These Care Teams include your primary Cardiologist (physician) and Advanced Practice Providers (APPs -  Physician Assistants and Nurse Practitioners) who all work together to provide you with the care you need, when you need it.  We recommend signing up for the patient portal called "MyChart".  Sign up information is provided on this After Visit Summary.  MyChart is used to connect with patients for Virtual Visits (Telemedicine).  Patients are able to view lab/test results, encounter notes, upcoming appointments, etc.  Non-urgent messages can be sent to your provider as well.   To learn more about what you can do with MyChart, go to [email or web address].    Your next appointment:  March 10,2023 at 9:00   The format for your next appointment:   In Person  Provider:   Rudean Haskell, MD   Other Instructions

## 2021-05-02 ENCOUNTER — Telehealth: Payer: Self-pay | Admitting: Internal Medicine

## 2021-05-02 NOTE — Telephone Encounter (Signed)
Werner Lean, MD  05/01/2021  1:50 PM EDT      Results: LDL above goal Plan: Increase rosuvastatin to 20 mg PO Daily; lipids in three months    Werner Lean, MD  Called patient in regards to lab results prior to seeing phone note.  Advised patient that her goal for LDL was <70. Patient would like to know if she needs to be taking zetia 10 mg daily as well as increase rosuvastatin to 20 mg daily.

## 2021-05-02 NOTE — Telephone Encounter (Signed)
Called patient back and informed her of Dr. Oralia Rud message. Patient verbalized understanding.

## 2021-05-02 NOTE — Telephone Encounter (Signed)
Per review of Pt's last lipid profile, her numbers are WNL.  Pt is calling to see why she needs to increase statin?  Please review and clarify.

## 2021-05-02 NOTE — Telephone Encounter (Signed)
Left message for patient to call back  

## 2021-05-02 NOTE — Telephone Encounter (Signed)
Patient called to follow up on a MyChart message to increase the dosage of a medication. She is at work now so the best number to reach her is at [phone]

## 2021-05-25 ENCOUNTER — Telehealth: Payer: Self-pay

## 2021-05-25 DIAGNOSIS — I700 Atherosclerosis of aorta: Secondary | ICD-10-CM

## 2021-05-25 DIAGNOSIS — E785 Hyperlipidemia, unspecified: Secondary | ICD-10-CM

## 2021-05-25 MED ORDER — EZETIMIBE 10 MG PO TABS: 10.0000 mg | ORAL_TABLET | Freq: Every day | ORAL | 3 refills | Status: DC

## 2021-05-25 NOTE — Telephone Encounter (Signed)
Pt returned call.  I informed her of MD recommendation that she continue Zetia 10 mg PO QD and rosuvastatin 10 mg PO QD. F/U lab appointment scheduled for 07/08/21 per pt request.  Pt verbalizes understanding.  Orders placed.

## 2021-05-25 NOTE — Telephone Encounter (Signed)
Zetia 10 mg added back to pt medication list per Dr. Gasper Sells.  Left a message for pt to call back to update her of MD recommendation.

## 2021-05-25 NOTE — Telephone Encounter (Signed)
-----   Message from Werner Lean, MD sent at 05/24/2021  8:34 AM EDT ----- Regarding: RE: cholesterol meds Based on her cholesterol and the fact that she does not wish to make any dietary changes and does not have time to make exercise changes both is recommended based on her CT scan and aggressive prevention goals  ----- Message ----- From: Wendy Gilding, RN Sent: 05/23/2021   3:00 PM EDT To: Werner Lean, MD Subject: cholesterol meds                               I called this pt to schedule f/u labs as they were not scheduled previously.   She reports that she is currently taking zetia 10 mg and rosuvastatin 10 mg. Only rosuvastatin 10 mg is on her med list.  For some reason on 05/04/21 zetia was stopped.  Her last LDL was 73 on 04/29/21.  Does she need to continue on both meds? I just want to make sure I didn't miss anything because I thought she was supposed to be on both.

## 2021-05-25 NOTE — Telephone Encounter (Signed)
-----   Message from Werner Lean, MD sent at 05/24/2021  8:34 AM EDT ----- Regarding: RE: cholesterol meds Based on her cholesterol and the fact that she does not wish to make any dietary changes and does not have time to make exercise changes both is recommended based on her CT scan and aggressive prevention goals  ----- Message ----- From: Precious Gilding, RN Sent: 05/23/2021   3:00 PM EDT To: Werner Lean, MD Subject: cholesterol meds                               I called this pt to schedule f/u labs as they were not scheduled previously.   She reports that she is currently taking zetia 10 mg and rosuvastatin 10 mg. Only rosuvastatin 10 mg is on her med list.  For some reason on 05/04/21 zetia was stopped.  Her last LDL was 73 on 04/29/21.  Does she need to continue on both meds? I just want to make sure I didn't miss anything because I thought she was supposed to be on both.

## 2021-06-03 ENCOUNTER — Ambulatory Visit
Admission: RE | Admit: 2021-06-03 | Discharge: 2021-06-03 | Disposition: A | Discharge status: Home / Self Care | Payer: BC Managed Care – PPO | Source: Ambulatory Visit | Attending: Family Medicine | Admitting: Family Medicine

## 2021-06-03 ENCOUNTER — Other Ambulatory Visit: Payer: Self-pay

## 2021-06-03 DIAGNOSIS — Z1231 Encounter for screening mammogram for malignant neoplasm of breast: Secondary | ICD-10-CM | POA: Diagnosis not present

## 2021-07-08 ENCOUNTER — Other Ambulatory Visit: Payer: Self-pay

## 2021-07-08 ENCOUNTER — Other Ambulatory Visit: Payer: BC Managed Care – PPO | Admitting: *Deleted

## 2021-07-08 DIAGNOSIS — I700 Atherosclerosis of aorta: Secondary | ICD-10-CM

## 2021-07-08 DIAGNOSIS — E785 Hyperlipidemia, unspecified: Secondary | ICD-10-CM

## 2021-07-08 LAB — HEPATIC FUNCTION PANEL
ALT: 13 IU/L (ref 0–32)
AST: 16 IU/L (ref 0–40)
Albumin: 4.3 g/dL (ref 3.8–4.8)
Alkaline Phosphatase: 68 IU/L (ref 44–121)
Bilirubin Total: 0.6 mg/dL (ref 0.0–1.2)
Bilirubin, Direct: 0.18 mg/dL (ref 0.00–0.40)
Total Protein: 6.5 g/dL (ref 6.0–8.5)

## 2021-07-08 LAB — LIPID PANEL
Chol/HDL Ratio: 2.1 ratio (ref 0.0–4.4)
Cholesterol, Total: 107 mg/dL (ref 100–199)
HDL: 51 mg/dL (ref >39)
LDL Chol Calc (NIH): 39 mg/dL (ref 0–99)
Triglycerides: 83 mg/dL (ref 0–149)
VLDL Cholesterol Cal: 17 mg/dL (ref 5–40)

## 2021-10-21 ENCOUNTER — Other Ambulatory Visit: Payer: Self-pay | Admitting: Family Medicine

## 2021-10-21 DIAGNOSIS — Z13820 Encounter for screening for osteoporosis: Secondary | ICD-10-CM

## 2021-10-21 DIAGNOSIS — I119 Hypertensive heart disease without heart failure: Secondary | ICD-10-CM | POA: Diagnosis not present

## 2021-10-21 DIAGNOSIS — E7800 Pure hypercholesterolemia, unspecified: Secondary | ICD-10-CM | POA: Diagnosis not present

## 2021-10-21 DIAGNOSIS — Z1231 Encounter for screening mammogram for malignant neoplasm of breast: Secondary | ICD-10-CM

## 2021-10-21 DIAGNOSIS — I700 Atherosclerosis of aorta: Secondary | ICD-10-CM | POA: Diagnosis not present

## 2021-10-21 DIAGNOSIS — I2510 Atherosclerotic heart disease of native coronary artery without angina pectoris: Secondary | ICD-10-CM | POA: Diagnosis not present

## 2021-10-21 DIAGNOSIS — Z0000 Encounter for general adult medical examination without abnormal findings: Secondary | ICD-10-CM | POA: Diagnosis not present

## 2021-11-02 DIAGNOSIS — J039 Acute tonsillitis, unspecified: Secondary | ICD-10-CM | POA: Diagnosis not present

## 2021-11-02 DIAGNOSIS — R509 Fever, unspecified: Secondary | ICD-10-CM | POA: Diagnosis not present

## 2021-11-02 DIAGNOSIS — Z03818 Encounter for observation for suspected exposure to other biological agents ruled out: Secondary | ICD-10-CM | POA: Diagnosis not present

## 2021-11-02 DIAGNOSIS — R52 Pain, unspecified: Secondary | ICD-10-CM | POA: Diagnosis not present

## 2021-11-28 NOTE — Progress Notes (Signed)
Cardiology Office Note:    Date:  12/02/2021   ID:  Wendy Zimmerman, DOB [birth year], MRN [id number]  PCP:  Kelton Pillar, MD   [place]  Cardiologist:  Werner Lean, MD  Advanced Practice Provider:  No care team member to display Electrophysiologist:  None      CC: CAD follow up  History of Present Illness:    Wendy Zimmerman is a 61 y.o. female with a hx of HTN, HLD Aortic Atherosclerosis, early family history of CAD who presents for evaluation 12/31/20 In interim of this visit, patient had CCTA and was found to have no obstructive disease. Started on rosuvastatin 10 mg PO Daily.  Seen in 2023.  Patient notes that she is doing fine.   Is always working but she is doing ok. There are no interval hospital/ED visit.   No change in exercise, weight loss, or diet.  No chest pain or pressure .  No SOB/DOE and no PND/Orthopnea.  No weight gain or leg swelling.  No palpitations or syncope.  Has been taking her medication.  I [facility] her at [facility] at [facility].   Past Medical History:  Diagnosis Date   Bowen disease    HTN (hypertension)    Hyperlipidemia    Osteoarthritis     Past Surgical History:  Procedure Laterality Date   CATARACT EXTRACTION     ERCP N/A 02/28/2019   Procedure: ENDOSCOPIC RETROGRADE CHOLANGIOPANCREATOGRAPHY (ERCP);  Surgeon: Clarene Essex, MD;  Location: [facility];  Service: Endoscopy;  Laterality: N/A;   HAND SURGERY     LAPAROSCOPIC CHOLECYSTECTOMY SINGLE PORT N/A 03/03/2019   Procedure: LAPAROSCOPIC CHOLECYSTECTOMY SINGLE SITE WITH INTRA OPERATIVE CHOLANGIOGRAM AND REPAIR OF UMBILICAL HERNIA;  Surgeon: Michael Boston, MD;  Location: [facility];  Service: General;  Laterality: N/A;   ROBOTIC ASSISTED TOTAL HYSTERECTOMY  07/14/2008   Dr Joan Flores   SPHINCTEROTOMY  02/28/2019   Procedure: SPHINCTEROTOMY;  Surgeon: Clarene Essex, MD;  Location: [facility];  Service: Endoscopy;;  balloon sweep    Current Medications: Current Meds   Medication Sig   aspirin EC 81 MG tablet Take 81 mg by mouth daily.   cycloSPORINE (RESTASIS) 0.05 % ophthalmic emulsion Place 1 drop into both eyes 2 (two) times daily.   esomeprazole (NEXIUM) 40 MG capsule as needed.   ezetimibe (ZETIA) 10 MG tablet Take 1 tablet (10 mg total) by mouth daily.   ibuprofen (ADVIL) 200 MG tablet You can take 2-3 tablets every 6 hours for pain not relieved by plain Tylenol/Acetaminophen.  You can start ibuprofen about 2 hours after you take your Tylenol/acetaminophen.  Alternate these as needed for pain relief.  You can buy this over the counter at any drug store.   Multiple Vitamin (MULTIVITAMIN WITH MINERALS) TABS tablet Take 1 tablet by mouth daily.   nitroGLYCERIN (NITROSTAT) 0.4 MG SL tablet Take 1 tablet wait 5 min, if Chest pain continues take 2nd dose, wait 5 min if CP continues take 3rd dose, wait 5 min if CP continues call 911   rosuvastatin (CRESTOR) 10 MG tablet Take 1 tablet (10 mg total) by mouth daily.   valsartan-hydrochlorothiazide (DIOVAN-HCT) 320-25 MG tablet Take 1 tablet by mouth daily.     Allergies:   Ciprofloxacin and Lisinopril   Social History   Socioeconomic History   Marital status: Divorced    Spouse name: Not on file   Number of children: Not on file   Years of education: Not on file  Highest education level: Not on file  Occupational History   Not on file  Tobacco Use   Smoking status: Never   Smokeless tobacco: Never  Substance and Sexual Activity   Alcohol use: Never   Drug use: Never   Sexual activity: Not on file  Other Topics Concern   Not on file  Social History Narrative   Not on file   Social Determinants of Health   Financial Resource Strain: Not on file  Food Insecurity: Not on file  Transportation Needs: Not on file  Physical Activity: Not on file  Stress: Not on file  Social Connections: Not on file    Social: Works at a [employer]; 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 Had sewage  issues long ago when we met, she has seen [place] before  Family History: The patient's family history includes CAD in an other family member; Cancer in an other family member; Hypertension in an other family member.  History of coronary artery disease notable for sister had MI at 46, father had MI. History of heart failure notable for father possible. History of arrhythmia notable for dad with AF.  ROS:   Please see the history of present illness.     All other systems reviewed and are negative.  EKGs/Labs/Other Studies Reviewed:    The following studies were reviewed today:  EKG:   12/28/20: SR borderline ant infarct 80  NonCardiac CT: Date: 10/22/2017 Results: LAD Calcium Aortic Atherosclerosis  Cardiac CT Date: 01/11/2021 Results: IMPRESSION: 1.  Total calcium score 199 which is 92 nd percentile for age / sex   2. CAD RADS 3 possibly obstructive disease in proximal LAD Study sent for FFR CT   3.  Normal aortic root 3.4 cm   4.  Lipomatous hypertrophy of the atrial septum  IMPRESSION: Abnormal FFR CT in LAD However only approaches obstructive significance in most distal segment at 0.81  Recent Labs: 07/08/2021: ALT 13  Recent Lipid Panel    Component Value Date/Time   CHOL 107 07/08/2021 0903   TRIG 83 07/08/2021 0903   HDL 51 07/08/2021 0903   CHOLHDL 2.1 07/08/2021 0903   LDLCALC 39 07/08/2021 0903    Physical Exam:    VS:  BP 120/86    Pulse 77    Ht 5' 3"  (1.6 m)    Wt 234 lb (106.1 kg)    SpO2 95%    BMI 41.45 kg/m     Wt Readings from Last 3 Encounters:  12/02/21 234 lb (106.1 kg)  04/29/21 233 lb 3.2 oz (105.8 kg)  12/31/20 228 lb 6.4 oz (103.6 kg)    Gen: no distress, morbid obesity   Neck: No JVD,  Cardiac: No Rubs or Gallops, no Murmur, RRR +2 radial pulses Respiratory: Clear to auscultation bilaterally, normal effort, normal  respiratory rate GI: Soft, nontender, non-distended  MS: No  edema;  moves all extremities Integument: Skin feels  warm Neuro:  At time of evaluation, alert and oriented to person/place/time/situation  Psych: Normal affect, patient feels    ASSESSMENT:    1. Aortic atherosclerosis ([place])   2. Morbid obesity ([place])   3. Primary hypertension   4. Coronary artery disease involving native coronary artery of native heart without angina pectoris     PLAN:    Moderate non-obstructive coronary artery disease Aortic Atherosclerosis Morbid Obesity with HTN Premature family history of CAD - continue ASA 81, Rosuvastatin 10 mg PO Daily, Zetia 10 mg  -  LDL < 55 - has need no PRN nitro 0.4 -continue 320-25  Diovan-HCTZ    Exercise Prescription  Frequency: three days per week to start  Intensity: 60 to 90 percent of heart rate reserve. Time: 20 minutes per session with 5 min increase per session Type: water aerobics, her pool Volume: goal of 60 minute sessions Step goal: 7000 per week with goal to increase by 500 each weak Progression: gradually increase the intensity or duration of exercise. Limitations: no cardiovascular limits  One year with me   Medication Adjustments/Labs and Tests Ordered: Current medicines are reviewed at length with the patient today.  Concerns regarding medicines are outlined above.  No orders of the defined types were placed in this encounter.   No orders of the defined types were placed in this encounter.    Patient Instructions  Medication Instructions:  Your physician recommends that you continue on your current medications as directed. Please refer to the Current Medication list given to you today.  *If you need a refill on your cardiac medications before your next appointment, please call your pharmacy*   Lab Work: NONE If you have labs (blood work) drawn today and your tests are completely normal, you will receive your results only by: [place] (if you have MyChart) OR A paper copy in the mail If you have any lab test that is abnormal or we need to  change your treatment, we will call you to review the results.   Testing/Procedures: NONE   Follow-Up: At [facility], you and your health needs are our priority.  As part of our continuing mission to provide you with exceptional heart care, we have created designated Provider Care Teams.  These Care Teams include your primary Cardiologist (physician) and Advanced Practice Providers (APPs -  Physician Assistants and Nurse Practitioners) who all work together to provide you with the care you need, when you need it.     Your next appointment:   1 year(s)  The format for your next appointment:   In Person  Provider:   Werner Lean, MD      Signed, Werner Lean, MD  12/02/2021 9:34 AM    [place]

## 2021-12-02 ENCOUNTER — Other Ambulatory Visit: Payer: Self-pay

## 2021-12-02 ENCOUNTER — Encounter: Payer: Self-pay | Admitting: Internal Medicine

## 2021-12-02 ENCOUNTER — Ambulatory Visit: Payer: BC Managed Care – PPO | Admitting: Internal Medicine

## 2021-12-02 VITALS — BP 120/86 | HR 77 | Ht 63.0 in | Wt 234.0 lb

## 2021-12-02 DIAGNOSIS — I2510 Atherosclerotic heart disease of native coronary artery without angina pectoris: Secondary | ICD-10-CM | POA: Diagnosis not present

## 2021-12-02 DIAGNOSIS — I700 Atherosclerosis of aorta: Secondary | ICD-10-CM

## 2021-12-02 DIAGNOSIS — I10 Essential (primary) hypertension: Secondary | ICD-10-CM

## 2021-12-02 NOTE — Patient Instructions (Signed)
Medication Instructions:  ?Your physician recommends that you continue on your current medications as directed. Please refer to the Current Medication list given to you today. ? ?*If you need a refill on your cardiac medications before your next appointment, please call your pharmacy* ? ? ?Lab Work: ?NONE ?If you have labs (blood work) drawn today and your tests are completely normal, you will receive your results only by: ?MyChart Message (if you have MyChart) OR ?A paper copy in the mail ?If you have any lab test that is abnormal or we need to change your treatment, we will call you to review the results. ? ? ?Testing/Procedures: ?NONE ? ? ?Follow-Up: ?At [facility], you and your health needs are our priority.  As part of our continuing mission to provide you with exceptional heart care, we have created designated Provider Care Teams.  These Care Teams include your primary Cardiologist (physician) and Advanced Practice Providers (APPs -  Physician Assistants and Nurse Practitioners) who all work together to provide you with the care you need, when you need it. ?   ? ?Your next appointment:   ?1 year(s) ? ?The format for your next appointment:   ?In Person ? ?Provider:   ?Werner Lean, MD   ? ?

## 2022-01-02 DIAGNOSIS — H1045 Other chronic allergic conjunctivitis: Secondary | ICD-10-CM | POA: Diagnosis not present

## 2022-04-07 ENCOUNTER — Other Ambulatory Visit: Payer: Self-pay | Admitting: Internal Medicine

## 2022-04-07 DIAGNOSIS — M1990 Unspecified osteoarthritis, unspecified site: Secondary | ICD-10-CM | POA: Diagnosis not present

## 2022-05-19 DIAGNOSIS — M170 Bilateral primary osteoarthritis of knee: Secondary | ICD-10-CM | POA: Diagnosis not present

## 2022-06-09 ENCOUNTER — Ambulatory Visit
Admission: RE | Admit: 2022-06-09 | Discharge: 2022-06-09 | Disposition: A | Discharge status: Home / Self Care | Payer: BC Managed Care – PPO | Source: Ambulatory Visit | Attending: Family Medicine | Admitting: Family Medicine

## 2022-06-09 DIAGNOSIS — Z13820 Encounter for screening for osteoporosis: Secondary | ICD-10-CM

## 2022-06-09 DIAGNOSIS — Z780 Asymptomatic menopausal state: Secondary | ICD-10-CM | POA: Diagnosis not present

## 2022-06-09 DIAGNOSIS — Z1231 Encounter for screening mammogram for malignant neoplasm of breast: Secondary | ICD-10-CM

## 2022-07-05 ENCOUNTER — Other Ambulatory Visit: Payer: Self-pay | Admitting: Internal Medicine

## 2022-09-15 DIAGNOSIS — M170 Bilateral primary osteoarthritis of knee: Secondary | ICD-10-CM | POA: Diagnosis not present

## 2022-09-20 DIAGNOSIS — H43813 Vitreous degeneration, bilateral: Secondary | ICD-10-CM | POA: Diagnosis not present

## 2022-09-22 DIAGNOSIS — M170 Bilateral primary osteoarthritis of knee: Secondary | ICD-10-CM | POA: Diagnosis not present

## 2022-09-29 DIAGNOSIS — M170 Bilateral primary osteoarthritis of knee: Secondary | ICD-10-CM | POA: Diagnosis not present

## 2022-10-27 DIAGNOSIS — E7800 Pure hypercholesterolemia, unspecified: Secondary | ICD-10-CM | POA: Diagnosis not present

## 2022-10-27 DIAGNOSIS — I2510 Atherosclerotic heart disease of native coronary artery without angina pectoris: Secondary | ICD-10-CM | POA: Diagnosis not present

## 2022-10-27 DIAGNOSIS — I119 Hypertensive heart disease without heart failure: Secondary | ICD-10-CM | POA: Diagnosis not present

## 2022-10-27 DIAGNOSIS — Z0000 Encounter for general adult medical examination without abnormal findings: Secondary | ICD-10-CM | POA: Diagnosis not present

## 2022-11-17 DIAGNOSIS — M170 Bilateral primary osteoarthritis of knee: Secondary | ICD-10-CM | POA: Diagnosis not present

## 2022-12-14 NOTE — Progress Notes (Signed)
Office Visit    Patient Name: Wendy Zimmerman Date of Encounter: 12/14/2022  Primary Care Provider:  Kelton Pillar, MD Primary Cardiologist:  Werner Lean, MD Primary Electrophysiologist: None  Chief Complaint    Wendy Zimmerman is a 62 y.o. female with PMH of nonobstructive CAD, aortic atherosclerosis, history of premature CAD, HLD, HTN, syncope who presents today for 1 year follow-up.  Past Medical History    Past Medical History:  Diagnosis Date   Bowen disease    HTN (hypertension)    Hyperlipidemia    Osteoarthritis    Past Surgical History:  Procedure Laterality Date   CATARACT EXTRACTION     ERCP N/A 02/28/2019   Procedure: ENDOSCOPIC RETROGRADE CHOLANGIOPANCREATOGRAPHY (ERCP);  Surgeon: Wendy Essex, MD;  Location: [facility];  Service: Endoscopy;  Laterality: N/A;   HAND SURGERY     LAPAROSCOPIC CHOLECYSTECTOMY SINGLE PORT N/A 03/03/2019   Procedure: LAPAROSCOPIC CHOLECYSTECTOMY SINGLE SITE WITH INTRA OPERATIVE CHOLANGIOGRAM AND REPAIR OF UMBILICAL HERNIA;  Surgeon: Wendy Boston, MD;  Location: [facility];  Service: General;  Laterality: N/A;   ROBOTIC ASSISTED TOTAL HYSTERECTOMY  07/14/2008   Dr Wendy Zimmerman   SPHINCTEROTOMY  02/28/2019   Procedure: SPHINCTEROTOMY;  Surgeon: Wendy Essex, MD;  Location: [facility];  Service: Endoscopy;;  balloon sweep    Allergies  Allergies  Allergen Reactions   Ciprofloxacin     Other reaction(s): rash   Lisinopril Swelling and Other (See Comments)    Face and tongue swelling    History of Present Illness    Wendy Zimmerman  is a 62 year old female with the above mention past medical history who presents today for 1 year follow-up of nonobstructive CAD and hypertension.  Wendy Zimmerman was initially seen by Dr. Gasper Zimmerman in 2022 following referral and complaint of syncope.  She underwent cardiac CTA that showed moderate nonobstructive disease in the proximal LAD and a calcium score of 199 which is 92%.  She was started on GDMT and was  last seen 12/02/2021 for annual follow-up.  During her visit she reported doing well and denied any cardiac complaints.  She was advised to start a exercise regimen with no medication changes made at that time.  Wendy Zimmerman presents today for 1 year follow-up of hypertension and nonobstructive CAD.  Since last being seen in the office patient reports she has been doing well from a cardiac perspective.  Her blood pressure today is well-controlled at 128/80.  She has been struggling with bilateral knee osteoarthritis and requires knee replacements.  She is interested in losing weight and during today's visit we discussed possibility of adding Wegovy.  She is scheduled to follow-up with bariatric services with [facility] next week.  Advised her to contact our office if Mancel Parsons is not an option during her visit and we will refer her to the Pharm.D.'s for further evaluation.  We also discussed the pathophysiology of atrial fibrillation and discussed the importance of maintaining good control of blood pressure and decreasing risk of remodeling of the atria.  She has not been evaluated for sleep apnea and I will complete evaluation at this time.  Patient denies chest pain, palpitations, dyspnea, PND, orthopnea, nausea, vomiting, dizziness, syncope, edema, weight gain, or early satiety.   Home Medications    Current Outpatient Medications  Medication Sig Dispense Refill   aspirin EC 81 MG tablet Take 81 mg by mouth daily.     cycloSPORINE (RESTASIS) 0.05 % ophthalmic emulsion Place 1 drop into  both eyes 2 (two) times daily.     esomeprazole (NEXIUM) 40 MG capsule as needed.     ezetimibe (ZETIA) 10 MG tablet TAKE 1 TABLET BY MOUTH EVERY DAY 90 tablet 1   ibuprofen (ADVIL) 200 MG tablet You can take 2-3 tablets every 6 hours for pain not relieved by plain Tylenol/Acetaminophen.  You can start ibuprofen about 2 hours after you take your Tylenol/acetaminophen.  Alternate these as needed for pain relief.  You can buy  this over the counter at any drug store.     Multiple Vitamin (MULTIVITAMIN WITH MINERALS) TABS tablet Take 1 tablet by mouth daily.     nitroGLYCERIN (NITROSTAT) 0.4 MG SL tablet Take 1 tablet wait 5 min, if Chest pain continues take 2nd dose, wait 5 min if CP continues take 3rd dose, wait 5 min if CP continues call 911 30 tablet 3   rosuvastatin (CRESTOR) 10 MG tablet TAKE 1 TABLET BY MOUTH EVERY DAY 90 tablet 2   valsartan-hydrochlorothiazide (DIOVAN-HCT) 320-25 MG tablet Take 1 tablet by mouth daily.     No current facility-administered medications for this visit.     Review of Systems  Please see the history of present illness.    (+) Bilateral knee pain (+) Anxiety  All other systems reviewed and are otherwise negative except as noted above.  Physical Exam    Wt Readings from Last 3 Encounters:  12/02/21 234 lb (106.1 kg)  04/29/21 233 lb 3.2 oz (105.8 kg)  12/31/20 228 lb 6.4 oz (103.6 kg)   [id number] were no vitals filed for this visit.,There is no height or weight on file to calculate BMI.  Constitutional:      Appearance: Healthy appearance. Not in distress.  Neck:     Vascular: JVD normal.  Pulmonary:     Effort: Pulmonary effort is normal.     Breath sounds: No wheezing. No rales. Diminished in the bases Cardiovascular:     Normal rate. Regular rhythm. Normal S1. Normal S2.      Murmurs: There is no murmur.  Edema:    Peripheral edema absent.  Abdominal:     Palpations: Abdomen is soft non tender. There is no hepatomegaly.  Skin:    General: Skin is warm and dry.  Neurological:     General: No focal deficit present.     Mental Status: Alert and oriented to person, place and time.     Cranial Nerves: Cranial nerves are intact.  EKG/LABS/ Recent Cardiac Studies    ECG personally reviewed by me today -sinus rhythm with left atrial enlargement and rate of 84 bpm with no acute changes    Sleep Apnea Evaluation  [facility]  Today's  Date: 12/15/2022   Patient Name: Wendy Zimmerman        DOB: 18-Dec-1960       Height:  5\' 3"  (1.6 m)     Weight: 239 lb 3.2 oz (108.5 kg)  BMI: Body mass index is 42.37 kg/m.    Referring Provider: Ambrose Pancoast, NP   STOP-BANG RISK ASSESSMENT       12/15/2022   11:15 AM  STOP-BANG  Do you snore loudly? No  Do you often feel tired, fatigued, or sleepy during the daytime? Yes  Has anyone observed you stop breathing during sleep? No  Do you have (or are you being treated for) high blood pressure? Yes  Recent BMI (Calculated) 42.38  Is BMI greater than 35 kg/m2? 1=Yes  Age older than 64 years old? 1=Yes  Has large neck size > 40 cm (15.7 in, large female shirt size, large female collar size > 16) Yes  Gender - Female 0=No  STOP-Bang Total Score 5      If STOP-BANG Score ?3 OR two clinical symptoms - patient qualifies for WatchPAT (CPT 95800)      Sleep study ordered due to two (2) of the following clinical symptoms/diagnoses:  Excessive daytime sleepiness G47.10  Gastroesophageal reflux K21.9  Nocturia R35.1  Morning Headaches G44.221  Difficulty concentrating R41.840  Memory problems or poor judgment G31.84  Personality changes or irritability R45.4  Loud snoring R06.83  Depression F32.9  Unrefreshed by sleep G47.8  Impotence N52.9  History of high blood pressure R03.0  Insomnia G47.00  Sleep Disordered Breathing or Sleep Apnea ICD G47.33       Lab Results  Component Value Date   WBC 6.1 03/03/2019   HGB 12.0 03/03/2019   HCT 38.7 03/03/2019   MCV 88.6 03/03/2019   PLT 296 03/03/2019   Lab Results  Component Value Date   CREATININE 0.98 03/03/2019   BUN 14 03/03/2019   NA 141 03/03/2019   K 3.9 03/03/2019   CL 105 03/03/2019   CO2 29 03/03/2019   Lab Results  Component Value Date   ALT 13 07/08/2021   AST 16 07/08/2021   ALKPHOS 68 07/08/2021   BILITOT 0.6 07/08/2021   Lab Results  Component Value Date   CHOL 107 07/08/2021   HDL 51 07/08/2021   LDLCALC 39  07/08/2021   TRIG 83 07/08/2021   CHOLHDL 2.1 07/08/2021    Lab Results  Component Value Date   HGBA1C 5.4 02/28/2019    Cardiac Studies & Procedures          CT SCANS  CT CORONARY MORPH W/CTA COR W/SCORE 01/11/2021  Addendum 01/11/2021  3:39 PM ADDENDUM REPORT: 01/11/2021 15:36  EXAM: OVER-READ INTERPRETATION  CT CHEST  The following report is an over-read performed by radiologist Dr. Rebekah Chesterfield [facility] on 01/11/2021. This over-read does not include interpretation of cardiac or coronary anatomy or pathology. The coronary calcium score and cardiac CTA interpretation by the cardiologist is attached.  COMPARISON:  None.  FINDINGS: Within the visualized portions of the thorax there are no suspicious appearing pulmonary nodules or masses, there is no acute consolidative airspace disease, no pleural effusions, no pneumothorax and no lymphadenopathy. Visualized portions of the upper abdomen are unremarkable. There are no aggressive appearing lytic or blastic lesions noted in the visualized portions of the skeleton.  IMPRESSION: 1. No significant incidental noncardiac findings are noted.   Electronically Signed By: Vinnie Langton M.D. On: 01/11/2021 15:36  Narrative CLINICAL DATA:  Chest pain  EXAM: Cardiac CTA  MEDICATIONS: Sub lingual nitro. 4mg  and Ivabradine 5 mg PO  TECHNIQUE: The patient was scanned on a Siemens Force [id number] slice scanner. Gantry rotation speed was 250 msecs. Collimation was .6 mm. A 100 kV prospective scan was triggered in the ascending thoracic aorta at 140 HU's Full mA was used between 35% and 75% of the R-R interval. Average HR during the scan was 63 bpm. The 3D data set was interpreted on a dedicated work station using MPR, MIP and VRT modes. A total of 80cc of contrast was used.  FINDINGS: Non-cardiac: See separate report from [facility]. No significant findings on limited lung and soft tissue  windows.  Calcium Score:  LM: 0  LAD: 177  RCA:  22.5  Circumflex: 0  Total 199  Coronary Arteries: Right dominant with no anomalies  LM: Normal  LAD: 50-69% long calcified plaque in proximal vessel 25-49% mixed plaque in mid vessel  IM large vessel normal  D1: Normal  Circumflex: Normal  OM1: Normal  OM2: Normal  RCA: 1-24% mixed plaque in the proximal and mid vessel  PDA: Normal  PLA: Normal  IMPRESSION: 1.  Total calcium score 199 which is 92 nd percentile for age / sex  2. CAD RADS 3 possibly obstructive disease in proximal LAD Study sent for FFR CT  3.  Normal aortic root 3.4 cm  4.  Lipomatous hypertrophy of the atrial septum  Jenkins Rouge  Electronically Signed: By: Jenkins Rouge M.D. On: 01/11/2021 15:22          Assessment & Plan    1.  Nonobstructive CAD: -s/p cardiac CTA that revealed calcium score of 199 and obstructive disease in LAD -Today patient reports no chest pain or shortness of breath since her previous visit.  She is currently unable to exercise due to bilateral osteoarthritis. -Continue GDMT with ASA 81 mg, Crestor 10 mg, ezetimibe 10 mg.  2.  Essential hypertension: -Patient's blood pressure today was well-controlled at 128/80 -Continue Diovan-HCTZ 300-25 mg  3.  Hyperlipidemia: -Patient's last LDL cholesterol was 49 currently ago -Continue Crestor and ezetimibe as prescribed  4.  Morbid obesity: -Patient's BMI is 42.37 she is currently unable to exercise due to bilateral knee osteoarthritis -During our visit today we discussed the possibility of adding [facility] and referral to the Pharm.D. if she is interested. -She is currently scheduled to follow-up with the bariatric center with [facility] and was advised to keep [place] informed of their recommendations.  5.  Sleep disturbance: -Patient was evaluated for possible sleep apnea with a STOP-BANG of 5 -We will submit preauthorization for Itamar home sleep  study.  Disposition: Follow-up with Werner Lean, MD or APP in 12 months    Medication Adjustments/Labs and Tests Ordered: Current medicines are reviewed at length with the patient today.  Concerns regarding medicines are outlined above.   Signed, Mable Fill, Marissa Nestle, NP 12/14/2022, 1:21 PM [facility]  Note:  This document was prepared using Dragon voice recognition software and may include unintentional dictation errors.

## 2022-12-15 ENCOUNTER — Other Ambulatory Visit: Payer: Self-pay

## 2022-12-15 ENCOUNTER — Ambulatory Visit: Payer: BC Managed Care – PPO | Attending: Nurse Practitioner | Admitting: Nurse Practitioner

## 2022-12-15 ENCOUNTER — Telehealth: Payer: Self-pay | Admitting: *Deleted

## 2022-12-15 ENCOUNTER — Encounter: Payer: Self-pay | Admitting: Nurse Practitioner

## 2022-12-15 VITALS — BP 128/80 | HR 84 | Ht 63.0 in | Wt 239.2 lb

## 2022-12-15 DIAGNOSIS — I10 Essential (primary) hypertension: Secondary | ICD-10-CM

## 2022-12-15 DIAGNOSIS — E7800 Pure hypercholesterolemia, unspecified: Secondary | ICD-10-CM | POA: Diagnosis not present

## 2022-12-15 DIAGNOSIS — G479 Sleep disorder, unspecified: Secondary | ICD-10-CM

## 2022-12-15 DIAGNOSIS — I2510 Atherosclerotic heart disease of native coronary artery without angina pectoris: Secondary | ICD-10-CM | POA: Diagnosis not present

## 2022-12-15 DIAGNOSIS — R400 Somnolence: Secondary | ICD-10-CM

## 2022-12-15 DIAGNOSIS — M170 Bilateral primary osteoarthritis of knee: Secondary | ICD-10-CM | POA: Diagnosis not present

## 2022-12-15 MED ORDER — NITROGLYCERIN 0.4 MG SL SUBL: SUBLINGUAL_TABLET | SUBLINGUAL | 3 refills | Status: AC

## 2022-12-15 NOTE — Telephone Encounter (Signed)
Pt was seen in the office today by Ambrose Pancoast, NP who ordered an Itamar sleep study. Beatris Si, CMA for NP set up the app and went over directions with the pt. I registered the device. Pt aware to not open the box until called with the PIN#.

## 2022-12-15 NOTE — Patient Instructions (Signed)
Medication Instructions:  [id number] Your physician recommends that you continue on your current medications as directed. Please refer to the Current Medication list given to you today. *If you need a refill on your cardiac medications before your next appointment, please call your pharmacy*   Lab Work: None ordered   Testing/Procedures: Itamar Sleep Study   Follow-Up: At [facility], you and your health needs are our priority.  As part of our continuing mission to provide you with exceptional heart care, we have created designated Provider Care Teams.  These Care Teams include your primary Cardiologist (physician) and Advanced Practice Providers (APPs -  Physician Assistants and Nurse Practitioners) who all work together to provide you with the care you need, when you need it.  We recommend signing up for the patient portal called "MyChart".  Sign up information is provided on this After Visit Summary.  MyChart is used to connect with patients for Virtual Visits (Telemedicine).  Patients are able to view lab/test results, encounter notes, upcoming appointments, etc.  Non-urgent messages can be sent to your provider as well.   To learn more about what you can do with MyChart, go to [email or web address].    Your next appointment:   12 month(s)  Provider:   Werner Lean, MD     Other Instructions

## 2022-12-15 NOTE — Telephone Encounter (Signed)
Patient was seen in the office today and requested a refill on nitroglycerin.  Rx(s) sent to pharmacy electronically.

## 2022-12-18 DIAGNOSIS — R6339 Other feeding difficulties: Secondary | ICD-10-CM | POA: Diagnosis not present

## 2022-12-18 DIAGNOSIS — K219 Gastro-esophageal reflux disease without esophagitis: Secondary | ICD-10-CM | POA: Diagnosis not present

## 2022-12-18 DIAGNOSIS — Z6841 Body Mass Index (BMI) 40.0 and over, adult: Secondary | ICD-10-CM | POA: Diagnosis not present

## 2022-12-18 DIAGNOSIS — I10 Essential (primary) hypertension: Secondary | ICD-10-CM | POA: Diagnosis not present

## 2022-12-18 DIAGNOSIS — Z1330 Encounter for screening examination for mental health and behavioral disorders, unspecified: Secondary | ICD-10-CM | POA: Diagnosis not present

## 2022-12-22 NOTE — Telephone Encounter (Signed)
Pt has been made aware Wendy Zimmerman study was denied. Pt will return the device Monday 12/25/22. Once the device has been returned I will un-register the device and place back into stock. Pt thanked me for the call.

## 2022-12-22 NOTE — Telephone Encounter (Signed)
Prior Authorization for [facility] sent to [facility] via web portal. Tracking Number .  DENIED-CRITERIA NOT MET-Order ID: [id number]

## 2022-12-27 NOTE — Telephone Encounter (Signed)
Pt had returned the Itamar sleep device. I was out of of the office for the last 2 days. I have unregistered the device today as I am back in the office. I will place the device back into stock.

## 2022-12-29 ENCOUNTER — Other Ambulatory Visit: Payer: Self-pay | Admitting: Internal Medicine

## 2023-03-16 DIAGNOSIS — M170 Bilateral primary osteoarthritis of knee: Secondary | ICD-10-CM | POA: Diagnosis not present

## 2023-04-02 ENCOUNTER — Other Ambulatory Visit: Payer: Self-pay | Admitting: Internal Medicine

## 2023-04-02 DIAGNOSIS — Z1231 Encounter for screening mammogram for malignant neoplasm of breast: Secondary | ICD-10-CM

## 2023-06-15 ENCOUNTER — Ambulatory Visit
Admission: RE | Admit: 2023-06-15 | Discharge: 2023-06-15 | Disposition: A | Discharge status: Home / Self Care | Payer: BC Managed Care – PPO | Source: Ambulatory Visit | Attending: Internal Medicine | Admitting: Internal Medicine

## 2023-06-15 DIAGNOSIS — Z1231 Encounter for screening mammogram for malignant neoplasm of breast: Secondary | ICD-10-CM | POA: Diagnosis not present

## 2023-07-06 DIAGNOSIS — M170 Bilateral primary osteoarthritis of knee: Secondary | ICD-10-CM | POA: Diagnosis not present

## 2023-09-17 DIAGNOSIS — H43813 Vitreous degeneration, bilateral: Secondary | ICD-10-CM | POA: Diagnosis not present

## 2023-12-28 ENCOUNTER — Telehealth: Payer: Self-pay

## 2023-12-28 NOTE — Telephone Encounter (Signed)
 [place] abnormal [place] from [facility].  No notes or f/u included.  Called [facility] to f/u was told Nurse on lunch provided our office number for nurse to f/u.   3:30 pm no reply from [facility].   Called pt who was on the phone with PCP office hung up to take my call.  Pt reports PCP was putting her on Eliquis.  I explained to pt the reason for this and advised her to call PCP office back.  I will either send to MD or speak with DOD for further instruction.   Spoke with DOD Dr. Lynnette Caffey advised pt needs to be on Eliquis, EKG scanned into EPIC and f/u OV with MD. Pt scheduled to see MD 01/04/24.

## 2023-12-28 NOTE — Telephone Encounter (Signed)
 Addendum: pt denies feeling funny heart beats.

## 2023-12-28 NOTE — Telephone Encounter (Signed)
 Spoke with pt reports PCP started Eliquis she was able to pick it up.  Will come to 01/04/24 OV.

## 2023-12-29 ENCOUNTER — Other Ambulatory Visit: Payer: Self-pay | Admitting: Internal Medicine

## 2024-01-04 ENCOUNTER — Ambulatory Visit: Payer: BC Managed Care – PPO | Admitting: Internal Medicine

## 2024-01-04 ENCOUNTER — Ambulatory Visit: Payer: BC Managed Care – PPO | Attending: Cardiology | Admitting: Internal Medicine

## 2024-01-04 ENCOUNTER — Ambulatory Visit (INDEPENDENT_AMBULATORY_CARE_PROVIDER_SITE_OTHER)

## 2024-01-04 VITALS — BP 112/80 | HR 83 | Ht 63.0 in | Wt 234.0 lb

## 2024-01-04 DIAGNOSIS — I10 Essential (primary) hypertension: Secondary | ICD-10-CM

## 2024-01-04 DIAGNOSIS — E7800 Pure hypercholesterolemia, unspecified: Secondary | ICD-10-CM

## 2024-01-04 DIAGNOSIS — I700 Atherosclerosis of aorta: Secondary | ICD-10-CM

## 2024-01-04 DIAGNOSIS — I4892 Unspecified atrial flutter: Secondary | ICD-10-CM

## 2024-01-04 DIAGNOSIS — I2510 Atherosclerotic heart disease of native coronary artery without angina pectoris: Secondary | ICD-10-CM

## 2024-01-04 NOTE — Patient Instructions (Signed)
 Medication Instructions:  Your physician recommends that you continue on your current medications as directed. Please refer to the Current Medication list given to you today.  *If you need a refill on your cardiac medications before your next appointment, please call your pharmacy*  Lab Work: CBC in 1 WEEK at any [employer]  If you have labs (blood work) drawn today and your tests are completely normal, you will receive your results only by: [employer] (if you have MyChart) OR A paper copy in the mail If you have any lab test that is abnormal or we need to change your treatment, we will call you to review the results.  Testing/Procedures: Your physician has requested that you have an echocardiogram. Echocardiography is a painless test that uses sound waves to create images of your heart. It provides your doctor with information about the size and shape of your heart and how well your heart's chambers and valves are working. This procedure takes approximately one hour. There are no restrictions for this procedure. Please do NOT wear cologne, perfume, aftershave, or lotions (deodorant is allowed). Please arrive 15 minutes prior to your appointment time.  Please note: We ask at that you not bring children with you during ultrasound (echo/ vascular) testing. Due to room size and safety concerns, children are not allowed in the ultrasound rooms during exams. Our front office staff cannot provide observation of children in our lobby area while testing is being conducted. An adult accompanying a patient to their appointment will only be allowed in the ultrasound room at the discretion of the ultrasound technician under special circumstances. We apologize for any inconvenience.  Your physician has requested that wear a heart monitor.   Follow-Up: At [facility], you and your health needs are our priority.  As part of our continuing mission to provide you with exceptional heart care, our  providers are all part of one team.  This team includes your primary Cardiologist (physician) and Advanced Practice Providers or APPs (Physician Assistants and Nurse Practitioners) who all work together to provide you with the care you need, when you need it.  Your next appointment:   12 month(s)  Provider:   Christell Constant, MD     Other Instructions Wendy Zimmerman- Long Term Monitor Instructions  Your physician has requested you wear a ZIO patch monitor for 14 days.  This is a single patch monitor. [employer] supplies one patch monitor per enrollment. Additional stickers are not available. Please do not apply patch if you will be having a Nuclear Stress Test,   Cardiac CT, MRI, or Chest Xray during the period you would be wearing the  monitor. The patch cannot be worn during these tests. You cannot remove and re-apply the  ZIO XT patch monitor.  Your ZIO patch monitor will be mailed 3 day USPS to your address on file. It may take 3-5 days  to receive your monitor after you have been enrolled.  Once you have received your monitor, please review the enclosed instructions. Your monitor  has already been registered assigning a specific monitor serial # to you.  Billing and Patient Assistance Program Information  We have supplied [employer] with any of your insurance information on file for billing purposes. [employer] offers a sliding scale Patient Assistance Program for patients that do not have  insurance, or whose insurance does not completely cover the cost of the ZIO monitor.  You must apply for the Patient Assistance Program to qualify for this  discounted rate.  To apply, please call [employer] at [phone], select option 4, select option 2, ask to apply for  Patient Assistance Program. Meredeth Ide will ask your household income, and how many people  are in your household. They will quote your out-of-pocket cost based on that information.  [employer] will also be able to set up a 44-month,  interest-free payment plan if needed.  Applying the monitor   Shave hair from upper left chest.  Hold abrader disc by orange tab. Rub abrader in 40 strokes over the upper left chest as  indicated in your monitor instructions.  Clean area with 4 enclosed alcohol pads. Let dry.  Apply patch as indicated in monitor instructions. Patch will be placed under collarbone on left  side of chest with arrow pointing upward.  Rub patch adhesive wings for 2 minutes. Remove white label marked "1". Remove the white  label marked "2". Rub patch adhesive wings for 2 additional minutes.  While looking in a mirror, press and release button in center of patch. A small green light will  flash 3-4 times. This will be your only indicator that the monitor has been turned on.  Do not shower for the first 24 hours. You may shower after the first 24 hours.  Press the button if you feel a symptom. You will hear a small click. Record Date, Time and  Symptom in the Patient Logbook.  When you are ready to remove the patch, follow instructions on the last 2 pages of Patient  Logbook. Stick patch monitor onto the last page of Patient Logbook.  Place Patient Logbook in the blue and white box. Use locking tab on box and tape box closed  securely. The blue and white box has prepaid postage on it. Please place it in the mailbox as  soon as possible. Your physician should have your test results approximately 7 days after the  monitor has been mailed back to [facility].  Call [facility] Customer Care at [phone] if you have questions regarding  your ZIO XT patch monitor. Call them immediately if you see an orange light blinking on your  monitor.  If your monitor falls off in less than 4 days, contact our Monitor department at [phone].  If your monitor becomes loose or falls off after 4 days call [employer] at [phone] for  suggestions on securing your monitor       1st Floor: - Lobby - Registration   - Pharmacy  - Lab - Cafe  2nd Floor: - PV Lab - Diagnostic Testing (echo, CT, nuclear med)  3rd Floor: - Vacant  4th Floor: - TCTS (cardiothoracic surgery) - AFib Clinic - Structural Heart Clinic - Vascular Surgery  - Vascular Ultrasound  5th Floor: - HeartCare Cardiology (general and EP) - Clinical Pharmacy for coumadin, hypertension, lipid, weight-loss medications, and med management appointments    Valet parking services will be available as well.

## 2024-01-04 NOTE — Progress Notes (Unsigned)
 Enrolled patient for a 14 day Zio XT  monitor to be mailed to patients home

## 2024-01-04 NOTE — Progress Notes (Signed)
 Cardiology Office Note:  .    Date:  01/04/2024  ID:  ABIGAILE ROSSIE, DOB 07-11-1961, MRN [id number] PCP: Maurice Small, MD (Inactive)  [facility] Providers Cardiologist:  Christell Constant, MD     CC: new AFL  History of Present Illness: .    Magdeline P Lausch is a 63 y.o. female  with paroxysmal atrial flutter who presents for evaluation of her condition. She was referred by her primary care doctor for evaluation of new atrial flutter.  She is currently asymptomatic with no chest pain, palpitations, or other symptoms typically associated with atrial flutter. She has been on a direct oral anticoagulant (DOAC) for a week without any bleeding complications.  Her past medical history includes coronary artery disease with non-obstructive blockages identified in 2022. She is asymptomatic and her cholesterol levels are well-controlled, with LDL under 55 mg/dL.  She has been intermittently ill since November, experiencing a 'head mess' around Thanksgiving and Christmas, but has continued working. She feels tired, attributing this to recent illnesses and recalls increased fatigue after taking Zyrtec.  She is currently fasting for a cholesterol check and reports no bleeding issues since starting the blood thinner. Her blood pressure is well-controlled, and her thyroid function tests, including TSH, were within normal limits last week.  No bleeding issues such as hematemesis, hematuria, or melena. Blood pressure is well-controlled and thyroid function tests were normal.   Relevant histories: .  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 Had sewage issues long ago when we met, she has seen [place] before at [employer]    ROS: As per HPI.   Studies Reviewed: .   Cardiac Studies & Procedures   ______________________________________________________________________________________________          CT SCANS  CT CORONARY MORPH W/CTA COR  W/SCORE 01/11/2021  Addendum 01/11/2021  3:39 PM ADDENDUM REPORT: 01/11/2021 15:36  EXAM: OVER-READ INTERPRETATION  CT CHEST  The following report is an over-read performed by radiologist Dr. [facility] on 01/11/2021. This over-read does not include interpretation of cardiac or coronary anatomy or pathology. The coronary calcium score and cardiac CTA interpretation by the cardiologist is attached.  COMPARISON:  None.  FINDINGS: Within the visualized portions of the thorax there are no suspicious appearing pulmonary nodules or masses, there is no acute consolidative airspace disease, no pleural effusions, no pneumothorax and no lymphadenopathy. Visualized portions of the upper abdomen are unremarkable. There are no aggressive appearing lytic or blastic lesions noted in the visualized portions of the skeleton.  IMPRESSION: 1. No significant incidental noncardiac findings are noted.   Electronically Signed By: Trudie Reed M.D. On: 01/11/2021 15:36  Narrative CLINICAL DATA:  Chest pain  EXAM: Cardiac CTA  MEDICATIONS: Sub lingual nitro. 4mg  and Ivabradine 5 mg PO  TECHNIQUE: The patient was scanned on a Siemens Force 192 slice scanner. Gantry rotation speed was 250 msecs. Collimation was .6 mm. A 100 kV prospective scan was triggered in the ascending thoracic aorta at 140 HU's Full mA was used between 35% and 75% of the R-R interval. Average HR during the scan was 63 bpm. The 3D data set was interpreted on a dedicated work station using MPR, MIP and VRT modes. A total of 80cc of contrast was used.  FINDINGS: Non-cardiac: See separate report from [facility]. No significant findings on limited lung and soft tissue windows.  Calcium Score:  LM: 0  LAD: 177  RCA: 22.5  Circumflex: 0  Total 199  Coronary Arteries: Right dominant with no anomalies  LM: Normal  LAD: 50-69% long calcified plaque in proximal vessel  25-49% mixed plaque in mid vessel  IM large vessel normal  D1: Normal  Circumflex: Normal  OM1: Normal  OM2: Normal  RCA: 1-24% mixed plaque in the proximal and mid vessel  PDA: Normal  PLA: Normal  IMPRESSION: 1.  Total calcium score 199 which is 92 nd percentile for age / sex  2. CAD RADS 3 possibly obstructive disease in proximal LAD Study sent for FFR CT  3.  Normal aortic root 3.4 cm  4.  Lipomatous hypertrophy of the atrial septum  Charlton Haws  Electronically Signed: By: Charlton Haws M.D. On: 01/11/2021 15:22     ______________________________________________________________________________________________      Discussed the use of AI scribe software for clinical note transcription with the patient, who gave verbal consent to proceed.   Physical Exam:    VS:  BP 112/80 (BP Location: Right Arm)   Pulse 83   Ht 5\' 3"  (1.6 m)   Wt 106.1 kg   SpO2 95%   BMI 41.45 kg/m    Wt Readings from Last 3 Encounters:  01/04/24 106.1 kg  12/15/22 108.5 kg  12/02/21 106.1 kg    Gen: no distress, morbid obesity Neck: No JVD Cardiac: No Rubs or Gallops, no Murmur, RRR +2 radial pulses Respiratory: Clear to auscultation bilaterally, normal effort, normal  respiratory rate GI: Soft, nontender, non-distended  MS: No  edema;  moves all extremities Integument: Skin feels warm Neuro:  At time of evaluation, alert and oriented to person/place/time/situation  Psych: Normal affect, patient feels ok   ASSESSMENT AND PLAN: .     An EKG was ordered for AFL and shows SR, rare PAC  Paroxysmal Atrial Flutter With morbid obesity - Diagnosed with paroxysmal atrial flutter, currently asymptomatic with no chest pain, palpitations, or other symptoms. On DOAC for a week without bleeding issues. The primary goal is to reduce the risk of stroke with DOAC therapy, which provides a true mortality benefit. The decision to pursue more aggressive treatment will depend on further  testing results and symptomatology. - Order CBC in one week to monitor for any issues with the blood thinner. - Order a two-week non-live heart monitor to assess the burden of atrial flutter and determine if it is symptomatic. - Order an echocardiogram to evaluate heart function and determine the need for aggressive treatment. - Consider ablation if symptomatic or if heart function is decreased. - Continue DOAC therapy to reduce the risk of stroke.  Coronary Artery Disease - Coronary artery disease with non-obstructive blockages. Currently asymptomatic, and cholesterol levels are well managed with an LDL under 55.  Hypertension - Hypertension is well controlled on the current regimen. Blood pressure readings are within normal limits.  Hyperlipidemia - Hyperlipidemia is well controlled with an LDL level under 55, indicating effective management of cholesterol levels.  If symptomatic AFL, we will discuss ablation.  Otherwise one year f/u  Riley Lam, MD FASE [facility] Cardiologist [facility]  [address] [place], [address] [place], [place] [address] [phone]  8:35 AM

## 2024-01-09 ENCOUNTER — Telehealth: Payer: Self-pay | Admitting: Internal Medicine

## 2024-01-09 MED ORDER — APIXABAN 5 MG PO TABS: 5.0000 mg | ORAL_TABLET | Freq: Two times a day (BID) | ORAL | 2 refills | Status: DC

## 2024-01-09 NOTE — Telephone Encounter (Signed)
 Spoke with the patient and advised that Dr. Paulita Boss was okay with refilling her Eliquis. Prescription has been sent in.

## 2024-01-09 NOTE — Telephone Encounter (Signed)
 Pt c/o medication issue:  1. Name of Medication: Eliquis 5 mg   2. How are you currently taking this medication (dosage and times per day)? Take 1 tablet twice a day  3. Are you having a reaction (difficulty breathing--STAT)? No   4. What is your medication issue? PCP put her on the medication and gave her samples but told her the heart doctor needs to be the one to manage med. Pt will need a script sent in

## 2024-01-10 LAB — CBC
Hematocrit: 42.5 % (ref 34.0–46.6)
Hemoglobin: 14.3 g/dL (ref 11.1–15.9)
MCH: 28.8 pg (ref 26.6–33.0)
MCHC: 33.6 g/dL (ref 31.5–35.7)
MCV: 86 fL (ref 79–97)
Platelets: 300 10*3/uL (ref 150–450)
RBC: 4.96 x10E6/uL (ref 3.77–5.28)
RDW: 13.1 % (ref 11.7–15.4)
WBC: 5.7 10*3/uL (ref 3.4–10.8)

## 2024-01-11 ENCOUNTER — Encounter: Payer: Self-pay | Admitting: Internal Medicine

## 2024-01-28 ENCOUNTER — Encounter: Payer: Self-pay | Admitting: Internal Medicine

## 2024-01-28 DIAGNOSIS — I2510 Atherosclerotic heart disease of native coronary artery without angina pectoris: Secondary | ICD-10-CM | POA: Diagnosis not present

## 2024-01-28 DIAGNOSIS — I10 Essential (primary) hypertension: Secondary | ICD-10-CM

## 2024-01-28 DIAGNOSIS — I4892 Unspecified atrial flutter: Secondary | ICD-10-CM

## 2024-01-28 NOTE — Telephone Encounter (Signed)
 Patient identification verified by 2 forms. Hilton Lucky, RN    Called and spoke to patient  RN reviewed provider result message  Informed patient:   -EP referral likely to come after Echo result   -keep 5/15 Echo appointment  Patient verbalized understanding, no questions at this time

## 2024-02-07 ENCOUNTER — Ambulatory Visit (HOSPITAL_COMMUNITY)
Admission: RE | Admit: 2024-02-07 | Discharge: 2024-02-07 | Disposition: A | Discharge status: Home / Self Care | Source: Ambulatory Visit | Attending: Cardiovascular Disease | Admitting: Cardiovascular Disease

## 2024-02-07 DIAGNOSIS — I2510 Atherosclerotic heart disease of native coronary artery without angina pectoris: Secondary | ICD-10-CM | POA: Diagnosis present

## 2024-02-07 DIAGNOSIS — I4892 Unspecified atrial flutter: Secondary | ICD-10-CM | POA: Insufficient documentation

## 2024-02-07 DIAGNOSIS — I10 Essential (primary) hypertension: Secondary | ICD-10-CM | POA: Diagnosis present

## 2024-02-07 DIAGNOSIS — E7800 Pure hypercholesterolemia, unspecified: Secondary | ICD-10-CM | POA: Diagnosis present

## 2024-02-07 LAB — ECHOCARDIOGRAM COMPLETE
Area-P 1/2: 2.21 cm2
Est EF: 55
S' Lateral: 2.9 cm

## 2024-02-13 ENCOUNTER — Ambulatory Visit: Payer: Self-pay

## 2024-02-13 DIAGNOSIS — I4892 Unspecified atrial flutter: Secondary | ICD-10-CM

## 2024-02-14 ENCOUNTER — Encounter: Payer: Self-pay | Admitting: Cardiology

## 2024-02-14 ENCOUNTER — Other Ambulatory Visit: Payer: Self-pay

## 2024-02-14 ENCOUNTER — Ambulatory Visit: Attending: Cardiology | Admitting: Cardiology

## 2024-02-14 VITALS — BP 118/78 | HR 96 | Ht 63.0 in | Wt 234.0 lb

## 2024-02-14 DIAGNOSIS — I10 Essential (primary) hypertension: Secondary | ICD-10-CM | POA: Diagnosis not present

## 2024-02-14 DIAGNOSIS — I480 Paroxysmal atrial fibrillation: Secondary | ICD-10-CM

## 2024-02-14 DIAGNOSIS — I4892 Unspecified atrial flutter: Secondary | ICD-10-CM

## 2024-02-14 NOTE — Progress Notes (Signed)
  Electrophysiology Office Note:    Date:  02/14/2024   ID:  Wendy Zimmerman, DOB 03-27-1961, MRN [id number]  [facility] Cardiologist:  Jann Melody, MD  [facility] Electrophysiologist:  Boyce Byes, MD   Referring MD: Gloriann Larger A*   Chief Complaint: Atrial flutter  History of Present Illness:     Wendy Zimmerman is a 63 year old woman who I am seeing today for an evaluation of atrial flutter at the request of Dr. Paulita Boss.  This was identified by her primary care physician and she was started on anticoagulation.  The patient describes fatigue that she associates with her atrial fibrillation.  She does not appreciate the palpitations.  No lightheadedness or dizziness.  She is taking Eliquis  twice daily for stroke prophylaxis.  She is interested in avoiding medical therapy for her atrial fibrillation and would prefer an invasive approach.  She has done some reading on catheter ablation prior to today's visit.    Their past medical, social and family history was reviewed.   ROS:   Please see the history of present illness.    All other systems reviewed and are negative.  EKGs/Labs/Other Studies Reviewed:    The following studies were reviewed today:  Feb 07, 2024 echo EF 55% RV normal No significant valve abnormalities  Jan 28, 2024 ZIO monitor personally reviewed 11% burden of atrial fibrillation and flutter, average rate 103 bpm, Conversion pauses lasting up to 4 seconds Occasional PACs Rare PVCs  January 04, 2024 EKG shows sinus rhythm with PAC.         Physical Exam:    VS:  BP 118/78   Pulse 96   Ht 5\' 3"  (1.6 m)   Wt 234 lb (106.1 kg)   SpO2 95%   BMI 41.45 kg/m     Wt Readings from Last 3 Encounters:  02/14/24 234 lb (106.1 kg)  01/04/24 234 lb (106.1 kg)  12/15/22 239 lb 3.2 oz (108.5 kg)     GEN: no distress CARD: RRR, No MRG RESP: No IWOB. CTAB.        ASSESSMENT AND PLAN:    1. Paroxysmal atrial fibrillation  (HCC)   2. Primary hypertension   3. Atrial flutter, unspecified type (HCC)     #Atrial fibrillation and flutter Burden 11%.  Associated with postconversion pauses. I did treatment options for her atrial fibrillation flutter during today's clinic appointment.  We discussed conservative management, antiarrhythmic drugs and catheter ablation.  Discussed treatment options today for AF including antiarrhythmic drug therapy and ablation. Discussed risks, recovery and likelihood of success with each treatment strategy. Risk, benefits, and alternatives to EP study and ablation for afib were discussed. These risks include but are not limited to stroke, bleeding, vascular damage, tamponade, perforation, damage to the esophagus, lungs, phrenic nerve and other structures, pulmonary vein stenosis, worsening renal function, coronary vasospasm and death.  Discussed potential need for repeat ablation procedures and antiarrhythmic drugs after an initial ablation. The patient understands these risk and wishes to proceed.  We will therefore proceed with catheter ablation at the next available time.  Carto, ICE, anesthesia are requested for the procedure.  Will also obtain CT PV protocol prior to the procedure to exclude LAA thrombus and further evaluate atrial anatomy.  Strategy would be PVI plus posterior wall plus CTI.        Signed, Leanora Prophet. Marven Slimmer, MD, [facility], [facility] 02/14/2024 1:59 PM    Electrophysiology [facility]

## 2024-02-14 NOTE — Patient Instructions (Addendum)
 Medication Instructions:  Your physician recommends that you continue on your current medications as directed. Please refer to the Current Medication list given to you today.  *If you need a refill on your cardiac medications before your next appointment, please call your pharmacy*  Lab Work: BMET and CBC - you may go to any [facility] location to have these drawn within 30 days of your procedure  Testing/Procedures: Cardiac CT Your physician has requested that you have cardiac CT. Cardiac computed tomography (CT) is a painless test that uses an x-ray machine to take clear, detailed pictures of your heart. For further information please visit [email or web address] We will call you to schedule your CT scan. It will be done about three weeks prior to your ablation.  Ablation Your physician has recommended that you have an ablation. Catheter ablation is a medical procedure used to treat some cardiac arrhythmias (irregular heartbeats). During catheter ablation, a long, thin, flexible tube is put into a blood vessel in your groin (upper thigh), or neck. This tube is called an ablation catheter. It is then guided to your heart through the blood vessel. Radio frequency waves destroy small areas of heart tissue where abnormal heartbeats may cause an arrhythmia to start.  You are scheduled for Atrial Fibrillation Ablation on Wednesday, August 13 with Dr. Harvie Liner.Please arrive at the Main Entrance A at [facility]: [address] [place], [place] [address] at 9:00 AM    Follow-Up: At [facility], you and your health needs are our priority.  As part of our continuing mission to provide you with exceptional heart care, we have created designated Provider Care Teams.  These Care Teams include your primary Cardiologist (physician) and Advanced Practice Providers (APPs -  Physician Assistants and Nurse Practitioners) who all work together to provide you with the care you need, when you need  it.   Your next appointment:   We will contact you about your post-procedure follow up appointments.

## 2024-02-19 ENCOUNTER — Encounter (HOSPITAL_COMMUNITY): Payer: Self-pay

## 2024-02-19 ENCOUNTER — Observation Stay (HOSPITAL_COMMUNITY)
Admission: EM | Admit: 2024-02-19 | Discharge: 2024-02-21 | Disposition: A | Discharge status: Home / Self Care | Attending: Internal Medicine | Admitting: Internal Medicine

## 2024-02-19 ENCOUNTER — Other Ambulatory Visit: Payer: Self-pay

## 2024-02-19 ENCOUNTER — Emergency Department (HOSPITAL_COMMUNITY)

## 2024-02-19 DIAGNOSIS — R0682 Tachypnea, not elsewhere classified: Secondary | ICD-10-CM | POA: Diagnosis not present

## 2024-02-19 DIAGNOSIS — E876 Hypokalemia: Secondary | ICD-10-CM | POA: Diagnosis not present

## 2024-02-19 DIAGNOSIS — N61 Mastitis without abscess: Principal | ICD-10-CM | POA: Insufficient documentation

## 2024-02-19 DIAGNOSIS — B029 Zoster without complications: Secondary | ICD-10-CM | POA: Diagnosis not present

## 2024-02-19 DIAGNOSIS — Z7901 Long term (current) use of anticoagulants: Secondary | ICD-10-CM | POA: Diagnosis not present

## 2024-02-19 DIAGNOSIS — R509 Fever, unspecified: Secondary | ICD-10-CM | POA: Insufficient documentation

## 2024-02-19 DIAGNOSIS — I4891 Unspecified atrial fibrillation: Secondary | ICD-10-CM | POA: Insufficient documentation

## 2024-02-19 DIAGNOSIS — R531 Weakness: Secondary | ICD-10-CM | POA: Diagnosis present

## 2024-02-19 DIAGNOSIS — I10 Essential (primary) hypertension: Secondary | ICD-10-CM | POA: Diagnosis not present

## 2024-02-19 DIAGNOSIS — N179 Acute kidney failure, unspecified: Secondary | ICD-10-CM | POA: Insufficient documentation

## 2024-02-19 DIAGNOSIS — A419 Sepsis, unspecified organism: Secondary | ICD-10-CM | POA: Insufficient documentation

## 2024-02-19 DIAGNOSIS — Z1152 Encounter for screening for COVID-19: Secondary | ICD-10-CM | POA: Insufficient documentation

## 2024-02-19 DIAGNOSIS — Z79899 Other long term (current) drug therapy: Secondary | ICD-10-CM | POA: Insufficient documentation

## 2024-02-19 LAB — COMPREHENSIVE METABOLIC PANEL WITH GFR
ALT: 17 U/L (ref 0–44)
AST: 33 U/L (ref 15–41)
Albumin: 3.2 g/dL — ABNORMAL LOW (ref 3.5–5.0)
Alkaline Phosphatase: 56 U/L (ref 38–126)
Anion gap: 10 (ref 5–15)
BUN: 13 mg/dL (ref 8–23)
CO2: 26 mmol/L (ref 22–32)
Calcium: 8 mg/dL — ABNORMAL LOW (ref 8.9–10.3)
Chloride: 103 mmol/L (ref 98–111)
Creatinine, Ser: 1.31 mg/dL — ABNORMAL HIGH (ref 0.44–1.00)
GFR, Estimated: 45 mL/min — ABNORMAL LOW (ref >60)
Glucose, Bld: 119 mg/dL — ABNORMAL HIGH (ref 70–99)
Potassium: 3.4 mmol/L — ABNORMAL LOW (ref 3.5–5.1)
Sodium: 139 mmol/L (ref 135–145)
Total Bilirubin: 1.7 mg/dL — ABNORMAL HIGH (ref 0.0–1.2)
Total Protein: 6.6 g/dL (ref 6.5–8.1)

## 2024-02-19 LAB — URINALYSIS, W/ REFLEX TO CULTURE (INFECTION SUSPECTED)
Bilirubin Urine: NEGATIVE
Glucose, UA: NEGATIVE mg/dL
Ketones, ur: NEGATIVE mg/dL
Nitrite: NEGATIVE
Protein, ur: 30 mg/dL — AB
Specific Gravity, Urine: 1.009 (ref 1.005–1.030)
WBC, UA: >50 WBC/hpf (ref 0–5)
pH: 7 (ref 5.0–8.0)

## 2024-02-19 LAB — CBC WITH DIFFERENTIAL/PLATELET
Abs Immature Granulocytes: 0.12 10*3/uL — ABNORMAL HIGH (ref 0.00–0.07)
Basophils Absolute: 0 10*3/uL (ref 0.0–0.1)
Basophils Relative: 0 %
Eosinophils Absolute: 0 10*3/uL (ref 0.0–0.5)
Eosinophils Relative: 0 %
HCT: 42.8 % (ref 36.0–46.0)
Hemoglobin: 14.3 g/dL (ref 12.0–15.0)
Immature Granulocytes: 1 %
Lymphocytes Relative: 7 %
Lymphs Abs: 1.2 10*3/uL (ref 0.7–4.0)
MCH: 28.8 pg (ref 26.0–34.0)
MCHC: 33.4 g/dL (ref 30.0–36.0)
MCV: 86.3 fL (ref 80.0–100.0)
Monocytes Absolute: 0.5 10*3/uL (ref 0.1–1.0)
Monocytes Relative: 3 %
Neutro Abs: 13.8 10*3/uL — ABNORMAL HIGH (ref 1.7–7.7)
Neutrophils Relative %: 89 %
Platelets: 239 10*3/uL (ref 150–400)
RBC: 4.96 MIL/uL (ref 3.87–5.11)
RDW: 14.3 % (ref 11.5–15.5)
WBC: 15.6 10*3/uL — ABNORMAL HIGH (ref 4.0–10.5)
nRBC: 0 % (ref 0.0–0.2)

## 2024-02-19 LAB — PROCALCITONIN: Procalcitonin: 2.2 ng/mL

## 2024-02-19 LAB — HIV ANTIBODY (ROUTINE TESTING W REFLEX): HIV Screen 4th Generation wRfx: NONREACTIVE

## 2024-02-19 LAB — I-STAT CG4 LACTIC ACID, ED
Lactic Acid, Venous: 2.4 mmol/L (ref 0.5–1.9)
Lactic Acid, Venous: 1.9 mmol/L (ref 0.5–1.9)

## 2024-02-19 LAB — TROPONIN I (HIGH SENSITIVITY)
Troponin I (High Sensitivity): 14 ng/L (ref <18)
Troponin I (High Sensitivity): 20 ng/L — ABNORMAL HIGH (ref <18)

## 2024-02-19 LAB — RESP PANEL BY RT-PCR (RSV, FLU A&B, COVID)  RVPGX2
Influenza A by PCR: NEGATIVE
Influenza B by PCR: NEGATIVE
Resp Syncytial Virus by PCR: NEGATIVE
SARS Coronavirus 2 by RT PCR: NEGATIVE

## 2024-02-19 MED ORDER — APIXABAN 5 MG PO TABS
5.0000 mg | ORAL_TABLET | Freq: Two times a day (BID) | ORAL | Status: DC
  Administered 2024-02-19 – 2024-02-21 (×4): 5 mg via ORAL
  Filled 2024-02-19 (×4): qty 1

## 2024-02-19 MED ORDER — SODIUM CHLORIDE 0.9 % IV BOLUS
1000.0000 mL | Freq: Once | INTRAVENOUS | Status: AC
  Administered 2024-02-19: 1000 mL via INTRAVENOUS

## 2024-02-19 MED ORDER — GABAPENTIN 300 MG PO CAPS
300.0000 mg | ORAL_CAPSULE | Freq: Three times a day (TID) | ORAL | Status: DC
  Administered 2024-02-19 – 2024-02-21 (×6): 300 mg via ORAL
  Filled 2024-02-19 (×6): qty 1

## 2024-02-19 MED ORDER — EZETIMIBE 10 MG PO TABS
10.0000 mg | ORAL_TABLET | Freq: Every day | ORAL | Status: DC
  Administered 2024-02-20 – 2024-02-21 (×2): 10 mg via ORAL
  Filled 2024-02-19 (×2): qty 1

## 2024-02-19 MED ORDER — ACETAMINOPHEN 325 MG PO TABS: 650.0000 mg | ORAL_TABLET | Freq: Four times a day (QID) | ORAL | Status: DC | PRN

## 2024-02-19 MED ORDER — ACETAMINOPHEN 500 MG PO TABS
1000.0000 mg | ORAL_TABLET | Freq: Four times a day (QID) | ORAL | Status: DC | PRN
  Administered 2024-02-19: 1000 mg via ORAL
  Filled 2024-02-19: qty 2

## 2024-02-19 MED ORDER — SODIUM CHLORIDE 0.9 % IV SOLN
3.0000 g | Freq: Once | INTRAVENOUS | Status: AC
  Administered 2024-02-19: 3 g via INTRAVENOUS
  Filled 2024-02-19: qty 8

## 2024-02-19 MED ORDER — VALSARTAN-HYDROCHLOROTHIAZIDE 320-25 MG PO TABS: 1.0000 | ORAL_TABLET | Freq: Every day | ORAL | Status: DC

## 2024-02-19 MED ORDER — ROSUVASTATIN CALCIUM 5 MG PO TABS
10.0000 mg | ORAL_TABLET | Freq: Every day | ORAL | Status: DC
  Administered 2024-02-20 – 2024-02-21 (×2): 10 mg via ORAL
  Filled 2024-02-19 (×2): qty 2

## 2024-02-19 MED ORDER — VANCOMYCIN HCL 2000 MG/400ML IV SOLN
2000.0000 mg | Freq: Once | INTRAVENOUS | Status: DC
  Filled 2024-02-19: qty 400

## 2024-02-19 MED ORDER — HYDROCHLOROTHIAZIDE 25 MG PO TABS
25.0000 mg | ORAL_TABLET | Freq: Every day | ORAL | Status: DC
  Administered 2024-02-20: 25 mg via ORAL
  Filled 2024-02-19: qty 1

## 2024-02-19 MED ORDER — LACTATED RINGERS IV SOLN: INTRAVENOUS | Status: AC

## 2024-02-19 MED ORDER — VANCOMYCIN HCL 1750 MG/350ML IV SOLN: 1750.0000 mg | Freq: Once | INTRAVENOUS | Status: DC

## 2024-02-19 MED ORDER — VALACYCLOVIR HCL 500 MG PO TABS
1000.0000 mg | ORAL_TABLET | Freq: Three times a day (TID) | ORAL | Status: DC
  Administered 2024-02-19 – 2024-02-21 (×6): 1000 mg via ORAL
  Filled 2024-02-19 (×8): qty 2

## 2024-02-19 MED ORDER — VANCOMYCIN HCL 1750 MG/350ML IV SOLN: 1750.0000 mg | INTRAVENOUS | Status: DC

## 2024-02-19 MED ORDER — IRBESARTAN 300 MG PO TABS
300.0000 mg | ORAL_TABLET | Freq: Every day | ORAL | Status: DC
  Administered 2024-02-20 – 2024-02-21 (×2): 300 mg via ORAL
  Filled 2024-02-19 (×2): qty 1

## 2024-02-19 MED ORDER — ACETAMINOPHEN 325 MG PO TABS
975.0000 mg | ORAL_TABLET | Freq: Once | ORAL | Status: AC
  Administered 2024-02-19: 975 mg via ORAL
  Filled 2024-02-19: qty 3

## 2024-02-19 NOTE — ED Triage Notes (Signed)
 Pt to ED via [facility] from MD office. Pt has not been feeling well for past few days, has had weakness, fatigue, and nausea. Pt went to appointment today, HR = 170s, BP 86/. Pt has had NS prior to arrival. Pt diagnosed w/atrial flutter a couple of months ago. Pt has had shortness of breath, being treated for mastitis in left breast. Pt has had a fever and vomiting yesterday. 130/90 HR 80-150s, aflutter. Cbg 161

## 2024-02-19 NOTE — ED Notes (Signed)
Got patient into a gown on the monitor did EKG shown to er provider patient is resting with call bell in reach  

## 2024-02-19 NOTE — Progress Notes (Signed)
 Pharmacy Antibiotic Note  Wendy Zimmerman is a 63 y.o. female admitted on 02/19/2024 with left mastitis.  Pharmacy has been consulted for vancomycin  dosing.  Plan: Vancomycin  2,000 mg IV load followed by vancomycin  1750 IV q48h (eAUC 491, Vd 0.5, Scr 1.31) Reassess dosing based on renal function De-escalate when possible Vanc levels as indicated  Height: 5\' 3"  (160 cm) Weight: 104.8 kg (231 lb) IBW/kg (Calculated) : 52.4  Temp (24hrs), Avg:100.2 F (37.9 C), Min:98.9 F (37.2 C), Max:102.4 F (39.1 C)  Recent Labs  Lab 02/19/24 0847 02/19/24 0855 02/19/24 0950 02/19/24 1049  WBC 15.6*  --   --   --   CREATININE  --   --  1.31*  --   LATICACIDVEN  --  2.4*  --  1.9    Estimated Creatinine Clearance: 49.6 mL/min (A) (by C-G formula based on SCr of 1.31 mg/dL (H)).    Allergies  Allergen Reactions   Ciprofloxacin      Other reaction(s): rash   Lisinopril Swelling and Other (See Comments)    Face and tongue swelling    Antimicrobials this admission: Unasyn  5/27 x1 Vanc 5/27 >>  Microbiology results: 5/27 BCx:   Thank you for allowing pharmacy to be a part of this patient's care.  Adaline Ada, PharmD PGY1 Pharmacy Resident 02/19/2024 3:21 PM

## 2024-02-19 NOTE — H&P (Addendum)
 History and Physical    Patient: Wendy Zimmerman ZOX:[id number] DOB: Jul 28, 1961 DOA: 02/19/2024 DOS: the patient was seen and examined on 02/19/2024 PCP: Elester Grim, MD  Patient coming from: Home  Chief Complaint:  Chief Complaint  Patient presents with   Weakness   HPI: Wendy Zimmerman is a 63 y.o. female with medical history significant of Afib (on OAC), HTN, HLD, and OA p/w AKI and tender L breast rash c/w shingles.  Pt states she was in her USOH until 0345 when she awoke with severe pain in her L breast accompanied by rash. Pt denies any pain prior to 0345 and has no history of breast pain, nipple discharge, or skin changes; of note, she reports getting yearly mammograms, which have been nl.  In the ED, pt tachycardic and tachypneic on 1L [place]. Labs notable for K 3.4, Cr 1.31, lactic acid 2.4-->1.9, and WBC 15.6. L breast US  pending.  Review of Systems: As mentioned in the history of present illness. All other systems reviewed and are negative. Past Medical History:  Diagnosis Date   Bowen disease    HTN (hypertension)    Hyperlipidemia    Osteoarthritis    Past Surgical History:  Procedure Laterality Date   CATARACT EXTRACTION     ERCP N/A 02/28/2019   Procedure: ENDOSCOPIC RETROGRADE CHOLANGIOPANCREATOGRAPHY (ERCP);  Surgeon: Ozell Blunt, MD;  Location: [facility];  Service: Endoscopy;  Laterality: N/A;   HAND SURGERY     LAPAROSCOPIC CHOLECYSTECTOMY SINGLE PORT N/A 03/03/2019   Procedure: LAPAROSCOPIC CHOLECYSTECTOMY SINGLE SITE WITH INTRA OPERATIVE CHOLANGIOGRAM AND REPAIR OF UMBILICAL HERNIA;  Surgeon: Candyce Champagne, MD;  Location: [facility];  Service: General;  Laterality: N/A;   ROBOTIC ASSISTED TOTAL HYSTERECTOMY  07/14/2008   Dr Denton Flakes   SPHINCTEROTOMY  02/28/2019   Procedure: SPHINCTEROTOMY;  Surgeon: Ozell Blunt, MD;  Location: [facility];  Service: Endoscopy;;  balloon sweep   Social History:  reports that she has never smoked. She has never used smokeless tobacco. She  reports that she does not drink alcohol and does not use drugs.  Allergies  Allergen Reactions   Ciprofloxacin      Other reaction(s): rash   Lisinopril Swelling and Other (See Comments)    Face and tongue swelling    Family History  Problem Relation Age of Onset   Breast cancer Mother    Cancer Other    Hypertension Other    CAD Other     Prior to Admission medications   Medication Sig Start Date End Date Taking? Authorizing Provider  acetaminophen  (TYLENOL ) 500 MG tablet Take 500-1,000 mg by mouth every 6 (six) hours as needed for moderate pain (pain score 4-6). 03/30/22  Yes [provider]  apixaban  (ELIQUIS ) 5 MG TABS tablet Take 1 tablet (5 mg total) by mouth 2 (two) times daily. 01/09/24  Yes Chandrasekhar, Mahesh A, MD  ezetimibe  (ZETIA ) 10 MG tablet TAKE 1 TABLET BY MOUTH EVERY DAY 12/31/23  Yes Chandrasekhar, Mahesh A, MD  nitroGLYCERIN  (NITROSTAT ) 0.4 MG SL tablet Take 1 tablet wait 5 min, if Chest pain continues take 2nd dose, wait 5 min if CP continues take 3rd dose, wait 5 min if CP continues call 911 12/15/22  Yes Gerald Kitty., NP  rosuvastatin  (CRESTOR ) 10 MG tablet TAKE 1 TABLET BY MOUTH EVERY DAY 12/31/23  Yes Chandrasekhar, Mahesh A, MD  valsartan -hydrochlorothiazide  (DIOVAN -HCT) 320-25 MG tablet Take 1 tablet by mouth daily.   Yes [provider]    Physical Exam:  Vitals:   02/19/24 1030 02/19/24 1230 02/19/24 1246 02/19/24 1315  BP: (!) 109/90  93/72 109/85  Pulse: 61 94 99 90  Resp: (!) 28 15 14 18   Temp:   99.2 F (37.3 C)   TempSrc:   Oral   SpO2: 97% 95% 94% 95%  Weight:      Height:       General: Alert, oriented x3, resting comfortably in no acute distress Respiratory: Lungs clear to auscultation bilaterally with normal respiratory effort; no w/r/r Cardiovascular: Regular rate and rhythm w/o m/r/g Skin: L breast w/ erythema  Data Reviewed:  Lab Results  Component Value Date   WBC 15.6 (H) 02/19/2024   HGB 14.3 02/19/2024    HCT 42.8 02/19/2024   MCV 86.3 02/19/2024   PLT 239 02/19/2024   Lab Results  Component Value Date   GLUCOSE 119 (H) 02/19/2024   CALCIUM  8.0 (L) 02/19/2024   NA 139 02/19/2024   K 3.4 (L) 02/19/2024   CO2 26 02/19/2024   CL 103 02/19/2024   BUN 13 02/19/2024   CREATININE 1.31 (H) 02/19/2024   Lab Results  Component Value Date   ALT 17 02/19/2024   AST 33 02/19/2024   ALKPHOS 56 02/19/2024   BILITOT 1.7 (H) 02/19/2024   No results found for: "INR", "PROTIME"  Radiology: DG Chest Portable 1 View Result Date: 02/19/2024 CLINICAL DATA:  Sepsis.  Fatigue and nausea.  Tachycardia.  Fever. EXAM: PORTABLE CHEST 1 VIEW COMPARISON:  10/22/2017 FINDINGS: The cardio pericardial silhouette is enlarged. Low lung volumes. Bibasilar atelectasis/infiltrate with probable small bilateral pleural effusions. Mild vascular congestion without overt edema. Telemetry leads overlie the chest. IMPRESSION: Low volume film with bibasilar atelectasis/infiltrate and probable small bilateral pleural effusions. Electronically Signed   By: Donnal Fusi M.D.   On: 02/19/2024 10:32    Assessment and Plan: 7F h/o Afib (on OAC), HTN, HLD, and OA p/w tender/swollen L breast c/w mastitis.   AKI Cr 1.31 (baseline 0.9) -MIVF: LR at 75cc/h for 1 day -Daily BMP -F/u PVR to r/o post-renal obstruction -Strict I&Os and daily weights (standing preferred) -Renally dose medications for CrCl -Avoid lovenox, NSAIDs, morphine, Fleet's phosphate enema, regular insulin, contrast; no gadolinium for MRI to avoid nephrogenic systemic fibrosis -Consider renal US  and nephrology consult if worsening AKI or lack of improvement  Presumed shingles Tachypnea Tachycardia Pt presented with painful and tender L breast c/r mastitis, for which she received IV abx. I have higher suspicion for shingles. -D/c IV abx -Start valacyclovir 1g TID x 7days -Start gabepentin 300mg  TID -Start Tylenol  1g QID -F/u blood cultures and  procalcitonin to exclude infectious etiology  H/o Afib -PTA apixaban  5mg  BID  H/o HTN -PTA valsartan /hydrochlorothiazide   H/o HLD -PTA crestor    Advance Care Planning:   Code Status: Full Code   Consults: N/A  Family Communication: N/A  Severity of Illness: The appropriate patient status for this patient is OBSERVATION. Observation status is judged to be reasonable and necessary in order to provide the required intensity of service to ensure the patient's safety. The patient's presenting symptoms, physical exam findings, and initial radiographic and laboratory data in the context of their medical condition is felt to place them at decreased risk for further clinical deterioration. Furthermore, it is anticipated that the patient will be medically stable for discharge from the hospital within 2 midnights of admission.    ------- I spent 55 minutes reviewing previous labs/notes, obtaining separate history at the bedside, counseling/discussing the treatment plan outlined  above, ordering medications/tests, and performing clinical documentation.  Author: Arne Langdon, MD 02/19/2024 2:38 PM  For on call review [email or web address].

## 2024-02-19 NOTE — ED Provider Notes (Signed)
 [place] EMERGENCY DEPARTMENT AT [facility] Provider Note   CSN: [id number] Arrival date & time: 02/19/24  4098     History  Chief Complaint  Patient presents with  . Weakness    Wendy Zimmerman is a 63 y.o. female.  With a history of atrial flutter on Eliquis , hypertension, CAD and hyperlipidemia who presents to the ED for weakness.  Patient reports ongoing general malaise, generalized weakness, fever nausea and vomiting for the last 2 to 3 days.  Yesterday she began to develop erythema and tenderness over the left breast.  For these reason she was seen by her PCP in the office this morning where she was noted to have heart rate atrial flutter in the 170s and blood pressure initially 80 systolic.  Improvement in heart rate and blood pressure after IV fluids from EMS.   Weakness      Home Medications Prior to Admission medications   Medication Sig Start Date End Date Taking? Authorizing Provider  acetaminophen  (TYLENOL ) 500 MG tablet Take 500-1,000 mg by mouth every 6 (six) hours as needed for moderate pain (pain score 4-6). 03/30/22  Yes [provider]  apixaban  (ELIQUIS ) 5 MG TABS tablet Take 1 tablet (5 mg total) by mouth 2 (two) times daily. 01/09/24  Yes Chandrasekhar, Mahesh A, MD  ezetimibe  (ZETIA ) 10 MG tablet TAKE 1 TABLET BY MOUTH EVERY DAY 12/31/23  Yes Chandrasekhar, Mahesh A, MD  nitroGLYCERIN  (NITROSTAT ) 0.4 MG SL tablet Take 1 tablet wait 5 min, if Chest pain continues take 2nd dose, wait 5 min if CP continues take 3rd dose, wait 5 min if CP continues call 911 12/15/22  Yes Gerald Kitty., NP  rosuvastatin  (CRESTOR ) 10 MG tablet TAKE 1 TABLET BY MOUTH EVERY DAY 12/31/23  Yes Chandrasekhar, Mahesh A, MD  valsartan -hydrochlorothiazide  (DIOVAN -HCT) 320-25 MG tablet Take 1 tablet by mouth daily.   Yes [provider]      Allergies    Ciprofloxacin  and Lisinopril    Review of Systems   Review of Systems  Neurological:  Positive for weakness.     Physical Exam Updated Vital Signs BP (!) 121/92   Pulse 95   Temp 99.2 F (37.3 C) (Oral)   Resp 18   Ht 5\' 3"  (1.6 m)   Wt 104.8 kg   SpO2 97%   BMI 40.92 kg/m  Physical Exam Vitals and nursing note reviewed.  HENT:     Head: Normocephalic and atraumatic.  Eyes:     Pupils: Pupils are equal, round, and reactive to light.  Cardiovascular:     Rate and Rhythm: Tachycardia present. Rhythm irregular.  Pulmonary:     Effort: Pulmonary effort is normal.     Breath sounds: Normal breath sounds.  Abdominal:     Palpations: Abdomen is soft.     Tenderness: There is no abdominal tenderness.  Skin:    General: Skin is warm and dry.     Comments: Erythema increased warmth and tenderness over left breast with no appreciable abscess, fluctuance or drainage  Neurological:     Mental Status: She is alert.  Psychiatric:        Mood and Affect: Mood normal.     ED Results / Procedures / Treatments   Labs (all labs ordered are listed, but only abnormal results are displayed) Labs Reviewed  CBC WITH DIFFERENTIAL/PLATELET - Abnormal; Notable for the following components:      Result Value   WBC 15.6 (*)  Neutro Abs 13.8 (*)    Abs Immature Granulocytes 0.12 (*)    All other components within normal limits  URINALYSIS, W/ REFLEX TO CULTURE (INFECTION SUSPECTED) - Abnormal; Notable for the following components:   APPearance CLOUDY (*)    Hgb urine dipstick SMALL (*)    Protein, ur 30 (*)    Leukocytes,Ua SMALL (*)    Bacteria, UA MANY (*)    All other components within normal limits  COMPREHENSIVE METABOLIC PANEL WITH GFR - Abnormal; Notable for the following components:   Potassium 3.4 (*)    Glucose, Bld 119 (*)    Creatinine, Ser 1.31 (*)    Calcium  8.0 (*)    Albumin 3.2 (*)    Total Bilirubin 1.7 (*)    GFR, Estimated 45 (*)    All other components within normal limits  I-STAT CG4 LACTIC ACID, ED - Abnormal; Notable for the following components:   Lactic Acid,  Venous 2.4 (*)    All other components within normal limits  TROPONIN I (HIGH SENSITIVITY) - Abnormal; Notable for the following components:   Troponin I (High Sensitivity) 20 (*)    All other components within normal limits  RESP PANEL BY RT-PCR (RSV, FLU A&B, COVID)  RVPGX2  CULTURE, BLOOD (ROUTINE X 2)  CULTURE, BLOOD (ROUTINE X 2)  HIV ANTIBODY (ROUTINE TESTING W REFLEX)  I-STAT CG4 LACTIC ACID, ED  TROPONIN I (HIGH SENSITIVITY)    EKG EKG Interpretation Date/Time:  Tuesday Feb 19 2024 08:46:20 EDT Ventricular Rate:  152 PR Interval:    QRS Duration:  105 QT Interval:  286 QTC Calculation: 464 R Axis:   -76  Text Interpretation: Atrial fibrillation with rapid V-rate Ventricular premature complex Markedly posterior QRS axis Repolarization abnormality, prob rate related Confirmed by Rafael Bun [phone]) on 02/19/2024 2:42:20 PM  Radiology DG Chest Portable 1 View Result Date: 02/19/2024 CLINICAL DATA:  Sepsis.  Fatigue and nausea.  Tachycardia.  Fever. EXAM: PORTABLE CHEST 1 VIEW COMPARISON:  10/22/2017 FINDINGS: The cardio pericardial silhouette is enlarged. Low lung volumes. Bibasilar atelectasis/infiltrate with probable small bilateral pleural effusions. Mild vascular congestion without overt edema. Telemetry leads overlie the chest. IMPRESSION: Low volume film with bibasilar atelectasis/infiltrate and probable small bilateral pleural effusions. Electronically Signed   By: Donnal Fusi M.D.   On: 02/19/2024 10:32    Procedures .Critical Care  Performed by: Sallyanne Creamer, DO Authorized by: Sallyanne Creamer, DO   Critical care provider statement:    Critical care time (minutes):  30   Critical care was necessary to treat or prevent imminent or life-threatening deterioration of the following conditions:  Sepsis   Critical care was time spent personally by me on the following activities:  Development of treatment plan with patient or surrogate, discussions with  consultants, evaluation of patient's response to treatment, examination of patient, ordering and review of laboratory studies, ordering and review of radiographic studies, ordering and performing treatments and interventions, pulse oximetry, re-evaluation of patient's condition, review of old charts and obtaining history from patient or surrogate   I assumed direction of critical care for this patient from another provider in my specialty: no     Care discussed with: admitting provider       Medications Ordered in ED Medications  acetaminophen  (TYLENOL ) tablet 650 mg (has no administration in time range)  apixaban  (ELIQUIS ) tablet 5 mg (has no administration in time range)  ezetimibe  (ZETIA ) tablet 10 mg (has no administration in time range)  rosuvastatin  (  CRESTOR ) tablet 10 mg (has no administration in time range)  valsartan -hydrochlorothiazide  (DIOVAN -HCT) 320-25 MG per tablet 1 tablet (has no administration in time range)  sodium chloride  0.9 % bolus 1,000 mL (0 mLs Intravenous Stopped 02/19/24 1023)  Ampicillin-Sulbactam (UNASYN) 3 g in sodium chloride  0.9 % 100 mL IVPB (0 g Intravenous Stopped 02/19/24 1023)  acetaminophen  (TYLENOL ) tablet 975 mg (975 mg Oral Given 02/19/24 0953)    ED Course/ Medical Decision Making/ A&P Clinical Course as of 02/19/24 1545  Tue Feb 19, 2024  0949 Initial laboratory workup notable for elevated venous lactic of 2.4 and leukocytosis of 15.6 with neutrophilic predominance.  Reevaluated patient.  Rate somewhat improved but still in a flutter 110s to 130s.  Will reassess after completion of the first liter [MP]  1324 Heart rate has improved.  Still in atrial flutter in the 90s.  No afebrile.  Hemodynamically stable.  Awaiting results of ultrasound left breast [MP]  1439 Patient remains hemodynamically stable at this time.  Awaiting formal ultrasound report.  Discussed with Dr. Sulema Endo (hospitalist) who accepts patient for admission [MP]  1544 Discussed with  radiology.  No breast abscess on ultrasound.  Findings more consistent with mastitis [MP]    Clinical Course User Index [MP] Sallyanne Creamer, DO                                 Medical Decision Making 63 year old female with history as above presenting from PCP office given concern for tachycardia and hypotension.  Fevers chills nausea vomiting for last few days.  Left mastitis.  A flutter with in a rate of 130s to 160s on my assessment.  Febrile tachypneic now slightly hypertensive after fluids.  Meeting SIRS criteria.  Most likely source would be left mastitis.  Will order left breast ultrasound.  Will obtain infectious workup including blood cultures lactic acid UA chest x-ray to look for pneumonia.  Will cover with broad-spectrum antibiotics.  Suspect a flutter most likely in setting of acute infectious illness and/or hypovolemia.  Will provide additional IV fluid bolus and continue to monitor heart rate and hemodynamic status and telemetry.  Admission likely.  Amount and/or Complexity of Data Reviewed Labs: ordered. Radiology: ordered.  Risk OTC drugs. Decision regarding hospitalization.           Final Clinical Impression(s) / ED Diagnoses Final diagnoses:  Acute mastitis of left breast  Fever, unspecified fever cause  Sepsis, due to unspecified organism, unspecified whether acute organ dysfunction present [facility])    Rx / DC Orders ED Discharge Orders     None         Sallyanne Creamer, DO 02/19/24 1442

## 2024-02-19 NOTE — ED Notes (Signed)
 XR at bedside

## 2024-02-19 NOTE — ED Notes (Signed)
 Phlebotomy to obtain second set of cultures

## 2024-02-20 DIAGNOSIS — N61 Mastitis without abscess: Secondary | ICD-10-CM | POA: Diagnosis not present

## 2024-02-20 DIAGNOSIS — B029 Zoster without complications: Secondary | ICD-10-CM | POA: Diagnosis not present

## 2024-02-20 DIAGNOSIS — N179 Acute kidney failure, unspecified: Secondary | ICD-10-CM | POA: Diagnosis not present

## 2024-02-20 DIAGNOSIS — R509 Fever, unspecified: Secondary | ICD-10-CM | POA: Diagnosis not present

## 2024-02-20 LAB — CBC WITH DIFFERENTIAL/PLATELET
Abs Immature Granulocytes: 0.04 10*3/uL (ref 0.00–0.07)
Basophils Absolute: 0.1 10*3/uL (ref 0.0–0.1)
Basophils Relative: 0 %
Eosinophils Absolute: 0 10*3/uL (ref 0.0–0.5)
Eosinophils Relative: 0 %
HCT: 40.5 % (ref 36.0–46.0)
Hemoglobin: 13.5 g/dL (ref 12.0–15.0)
Immature Granulocytes: 0 %
Lymphocytes Relative: 14 %
Lymphs Abs: 1.8 10*3/uL (ref 0.7–4.0)
MCH: 28.7 pg (ref 26.0–34.0)
MCHC: 33.3 g/dL (ref 30.0–36.0)
MCV: 86.2 fL (ref 80.0–100.0)
Monocytes Absolute: 0.6 10*3/uL (ref 0.1–1.0)
Monocytes Relative: 5 %
Neutro Abs: 10.3 10*3/uL — ABNORMAL HIGH (ref 1.7–7.7)
Neutrophils Relative %: 81 %
Platelets: 213 10*3/uL (ref 150–400)
RBC: 4.7 MIL/uL (ref 3.87–5.11)
RDW: 14.3 % (ref 11.5–15.5)
WBC: 12.8 10*3/uL — ABNORMAL HIGH (ref 4.0–10.5)
nRBC: 0 % (ref 0.0–0.2)

## 2024-02-20 LAB — BASIC METABOLIC PANEL WITH GFR
Anion gap: 9 (ref 5–15)
BUN: 12 mg/dL (ref 8–23)
CO2: 26 mmol/L (ref 22–32)
Calcium: 8.5 mg/dL — ABNORMAL LOW (ref 8.9–10.3)
Chloride: 103 mmol/L (ref 98–111)
Creatinine, Ser: 1 mg/dL (ref 0.44–1.00)
GFR, Estimated: >60 mL/min (ref >60)
Glucose, Bld: 116 mg/dL — ABNORMAL HIGH (ref 70–99)
Potassium: 3.5 mmol/L (ref 3.5–5.1)
Sodium: 138 mmol/L (ref 135–145)

## 2024-02-20 LAB — HIV ANTIBODY (ROUTINE TESTING W REFLEX)

## 2024-02-20 MED ORDER — SODIUM CHLORIDE 0.9 % IV SOLN
3.0000 g | Freq: Four times a day (QID) | INTRAVENOUS | Status: DC
  Administered 2024-02-20 – 2024-02-21 (×4): 3 g via INTRAVENOUS
  Filled 2024-02-20 (×4): qty 8

## 2024-02-20 MED ORDER — ACETAMINOPHEN 325 MG PO TABS
650.0000 mg | ORAL_TABLET | Freq: Four times a day (QID) | ORAL | Status: DC | PRN
  Administered 2024-02-20: 650 mg via ORAL
  Filled 2024-02-20: qty 2

## 2024-02-20 MED ORDER — POTASSIUM CHLORIDE CRYS ER 20 MEQ PO TBCR
40.0000 meq | EXTENDED_RELEASE_TABLET | Freq: Two times a day (BID) | ORAL | Status: AC
  Administered 2024-02-20 (×2): 40 meq via ORAL
  Filled 2024-02-20 (×2): qty 2

## 2024-02-20 NOTE — Plan of Care (Signed)

## 2024-02-20 NOTE — Progress Notes (Signed)
 Transition of Care [facility]) - Inpatient Brief Assessment   Patient Details  Name: Wendy Zimmerman MRN: [id number] Date of Birth: 05-18-1961  Transition of Care [facility]) CM/SW Contact:    Dane Dung, RN Phone Number: 02/20/2024, 12:00 PM   Clinical Narrative: CM met with the patient at the bedside to discuss TOC needs.  Patient admitted to the hospital for AKI/Shingles and patient plans to return home.  No DME at the home.  [place] letter was provided at the bedside.  I called the patient's PCP and requested hospital follow up - office will call the patient for follow up visit.   Transition of Care Asessment: Insurance and Status: (P) Insurance coverage has been reviewed Patient has primary care physician: (P) Yes Home environment has been reviewed: (P) from home alone Prior level of function:: (P) Independent Prior/Current Home Services: (P) No current home services Social Drivers of Health Review: (P) SDOH reviewed interventions complete Readmission risk has been reviewed: (P) Yes Transition of care needs: (P) transition of care needs identified, TOC will continue to follow

## 2024-02-20 NOTE — Progress Notes (Signed)
 [facility]                                                                               [name], is a 63 y.o. female, DOB - January 06, 1961, EXB:[id number] Admit date - 02/19/2024    Outpatient Primary MD for the patient is Pahwani, Regino Caprio, MD  LOS - 0  days    Brief summary   Wendy Zimmerman is a 63 y.o. female with medical history significant of Afib (on OAC), HTN, HLD, and OA p/w AKI and tender L breast rash c/w shingles.   US  breast shows Diffuse thickening of the skin of the LEFT periareolar region, most consistent with mastitis. No organized abscess is identified. Recommend repeat LEFT breast ultrasound and BILATERAL mammogram in approximately 2 weeks after the patient has completed antibiotic therapy.   Assessment & Plan    Assessment and Plan:  Left sided mastitis Differential include shingles.  Pro calcitonin elevated. Restart antibiotics. Continue with valacyclovir 1 g for 7 days.  Continue with pain control.  Follow up blood cultures.     AKI Suspect pre renal.  Continue with IV fluids.  UA is abnormal. Urine cultures ordered.  Recheck labs today.    Hypokalemia Replaced.    Atrial fibrillation Rate controlled.  Continue with eliquis .    Hyperlipidemia Continue with crestor  and zetia .    Hypertension Well controlled.  Holding hydrochlorothiazide  for AKI.   Estimated body mass index is 40.92 kg/m as calculated from the following:   Height as of this encounter: 5\' 3"  (1.6 m).   Weight as of this encounter: 104.8 kg.  Code Status: full code.  DVT Prophylaxis:   apixaban  (ELIQUIS ) tablet 5 mg   Level of Care: Level of care: Med-Surg Family Communication:none at bedside.   Disposition Plan:     Remains inpatient appropriate:  PENDING.  Procedures:  None.   Consultants:   None.  Antimicrobials:   Anti-infectives (From admission, onward)    Start     Dose/Rate Route Frequency Ordered Stop   02/21/24 1530  vancomycin  (VANCOREADY) IVPB 1750 mg/350 mL  Status:  Discontinued        1,750 mg 175 mL/hr over 120 Minutes Intravenous Every 48 hours 02/19/24 1537 02/19/24 1601   02/21/24 0000  vancomycin (VANCOREADY) IVPB 1750 mg/350 mL  Status:  Discontinued        1,750 mg 175 mL/hr over 120 Minutes Intravenous  Once 02/19/24 1526 02/19/24 1537   02/20/24 1215  Ampicillin-Sulbactam (UNASYN) 3 g in sodium chloride  0.9 % 100 mL IVPB        3 g 200 mL/hr over 30 Minutes Intravenous Every 6 hours 02/20/24 1149     02/19/24 1700  valACYclovir (VALTREX) tablet 1,000 mg        1,000 mg Oral 3 times daily 02/19/24 1608 02/26/24 1559   02/19/24 1530  vancomycin (VANCOREADY) IVPB 2000 mg/400 mL  Status:  Discontinued        2,000 mg 200 mL/hr over 120 Minutes Intravenous  Once 02/19/24 1517 02/19/24 1601   02/19/24 0915  Ampicillin-Sulbactam (UNASYN) 3 g in sodium chloride  0.9 % 100 mL IVPB  3 g 200 mL/hr over 30 Minutes Intravenous  Once 02/19/24 7253 02/19/24 1023        Medications  Scheduled Meds:  apixaban   5 mg Oral BID   ezetimibe   10 mg Oral Daily   gabapentin   300 mg Oral TID   irbesartan   300 mg Oral Daily   And   hydrochlorothiazide   25 mg Oral Daily   potassium chloride   40 mEq Oral BID   rosuvastatin   10 mg Oral Daily   valACYclovir   1,000 mg Oral TID   Continuous Infusions:  ampicillin -sulbactam (UNASYN ) IV     lactated ringers  75 mL/hr at 02/20/24 0450   PRN Meds:.acetaminophen     Subjective:   Wendy Zimmerman was seen and examined today.  Improving pain in the left breast.     Objective:   Vitals:   02/19/24 1745 02/19/24 2018 02/19/24 2104 02/20/24 0813  BP:  94/69 112/67 101/67  Pulse:  92 91 100  Resp:  18  18  Temp: 99.6 F (37.6 C) 97.8 F (36.6 C)  99 F (37.2 C)  TempSrc: Oral Oral  Oral  SpO2:  98%  92%  Weight:      Height:        Intake/Output Summary (Last 24 hours) at 02/20/2024 1155 Last data filed at 02/20/2024 0700 Gross per 24 hour  Intake 500 ml   Output --  Net 500 ml   Filed Weights   02/19/24 0841  Weight: 104.8 kg     Exam General exam: Appears calm and comfortable  Respiratory system: Clear to auscultation. Respiratory effort normal. Cardiovascular system: S1 & S2 heard, RRR. No JVD, Gastrointestinal system: Abdomen is nondistended, soft and nontender.  Central nervous system: Alert and oriented. No focal neurological deficits. Extremities: Symmetric 5 x 5 power. Skin: left breast redness and tenderness. Psychiatry: Mood & affect appropriate.    Data Reviewed:  I have personally reviewed following labs and imaging studies   CBC Lab Results  Component Value Date   WBC 12.8 (H) 02/20/2024   RBC 4.70 02/20/2024   HGB 13.5 02/20/2024   HCT 40.5 02/20/2024   MCV 86.2 02/20/2024   MCH 28.7 02/20/2024   PLT 213 02/20/2024   MCHC 33.3 02/20/2024   RDW 14.3 02/20/2024   LYMPHSABS 1.8 02/20/2024   MONOABS 0.6 02/20/2024   EOSABS 0.0 02/20/2024   BASOSABS 0.1 02/20/2024     Last metabolic panel Lab Results  Component Value Date   NA 139 02/19/2024   K 3.4 (L) 02/19/2024   CL 103 02/19/2024   CO2 26 02/19/2024   BUN 13 02/19/2024   CREATININE 1.31 (H) 02/19/2024   GLUCOSE 119 (H) 02/19/2024   GFRNONAA 45 (L) 02/19/2024   GFRAA >60 03/03/2019   CALCIUM  8.0 (L) 02/19/2024   PROT 6.6 02/19/2024   ALBUMIN 3.2 (L) 02/19/2024   BILITOT 1.7 (H) 02/19/2024   ALKPHOS 56 02/19/2024   AST 33 02/19/2024   ALT 17 02/19/2024   ANIONGAP 10 02/19/2024    CBG (last 3)  No results for input(s): "GLUCAP" in the last 72 hours.    Coagulation Profile: No results for input(s): "INR", "PROTIME" in the last 168 hours.   Radiology Studies: US  LIMITED ULTRASOUND INCLUDING AXILLA LEFT BREAST  Result Date: 02/19/2024 CLINICAL DATA:  63 year old woman with fever and nausea developed diffuse erythema and swelling of the LEFT periareolar region in the last 1-2 days, suspicious for mastitis and possible abscess. EXAM:  ULTRASOUND OF THE LEFT BREAST  COMPARISON:  Previous exam(s). FINDINGS: Targeted sonographic evaluation of the LEFT nipple and periareolar region demonstrates diffuse skin thickening without discrete fluid collection. IMPRESSION: Diffuse thickening of the skin of the LEFT periareolar region, most consistent with mastitis. No organized abscess is identified. RECOMMENDATION: Recommend repeat LEFT breast ultrasound and BILATERAL mammogram in approximately 2 weeks after the patient has completed antibiotic therapy. If patient's symptoms worsen, ultrasound should be performed earlier. I have discussed the findings and recommendations with the patient. If applicable, a reminder letter will be sent to the patient regarding the next appointment. BI-RADS CATEGORY  2: Benign. Electronically Signed   By: Elester Grim M.D.   On: 02/19/2024 16:12   DG Chest Portable 1 View Result Date: 02/19/2024 CLINICAL DATA:  Sepsis.  Fatigue and nausea.  Tachycardia.  Fever. EXAM: PORTABLE CHEST 1 VIEW COMPARISON:  10/22/2017 FINDINGS: The cardio pericardial silhouette is enlarged. Low lung volumes. Bibasilar atelectasis/infiltrate with probable small bilateral pleural effusions. Mild vascular congestion without overt edema. Telemetry leads overlie the chest. IMPRESSION: Low volume film with bibasilar atelectasis/infiltrate and probable small bilateral pleural effusions. Electronically Signed   By: Donnal Fusi M.D.   On: 02/19/2024 10:32       Feliciana Horn M.D. [facility] 02/20/2024, 11:55 AM  Available via Epic secure chat 7am-7pm After 7 pm, please refer to night coverage provider listed on amion.

## 2024-02-20 NOTE — Care Management Obs Status (Cosign Needed)
 MEDICARE OBSERVATION STATUS NOTIFICATION   Patient Details  Name: Wendy Zimmerman MRN: [id number] Date of Birth: [birth year]   Medicare Observation Status Notification Given:  Yes    Dane Dung, RN 02/20/2024, 11:43 AM

## 2024-02-21 DIAGNOSIS — N61 Mastitis without abscess: Secondary | ICD-10-CM | POA: Diagnosis not present

## 2024-02-21 DIAGNOSIS — B029 Zoster without complications: Secondary | ICD-10-CM | POA: Diagnosis not present

## 2024-02-21 DIAGNOSIS — N179 Acute kidney failure, unspecified: Secondary | ICD-10-CM | POA: Diagnosis not present

## 2024-02-21 LAB — CBC WITH DIFFERENTIAL/PLATELET
Abs Immature Granulocytes: 0.03 10*3/uL (ref 0.00–0.07)
Basophils Absolute: 0 10*3/uL (ref 0.0–0.1)
Basophils Relative: 1 %
Eosinophils Absolute: 0.1 10*3/uL (ref 0.0–0.5)
Eosinophils Relative: 2 %
HCT: 36.3 % (ref 36.0–46.0)
Hemoglobin: 12.2 g/dL (ref 12.0–15.0)
Immature Granulocytes: 0 %
Lymphocytes Relative: 21 %
Lymphs Abs: 1.4 10*3/uL (ref 0.7–4.0)
MCH: 28.4 pg (ref 26.0–34.0)
MCHC: 33.6 g/dL (ref 30.0–36.0)
MCV: 84.4 fL (ref 80.0–100.0)
Monocytes Absolute: 0.5 10*3/uL (ref 0.1–1.0)
Monocytes Relative: 7 %
Neutro Abs: 4.7 10*3/uL (ref 1.7–7.7)
Neutrophils Relative %: 69 %
Platelets: 188 10*3/uL (ref 150–400)
RBC: 4.3 MIL/uL (ref 3.87–5.11)
RDW: 14.2 % (ref 11.5–15.5)
WBC: 6.8 10*3/uL (ref 4.0–10.5)
nRBC: 0 % (ref 0.0–0.2)

## 2024-02-21 LAB — BASIC METABOLIC PANEL WITH GFR
Anion gap: 9 (ref 5–15)
BUN: 10 mg/dL (ref 8–23)
CO2: 25 mmol/L (ref 22–32)
Calcium: 8.1 mg/dL — ABNORMAL LOW (ref 8.9–10.3)
Chloride: 104 mmol/L (ref 98–111)
Creatinine, Ser: 0.92 mg/dL (ref 0.44–1.00)
GFR, Estimated: >60 mL/min (ref >60)
Glucose, Bld: 102 mg/dL — ABNORMAL HIGH (ref 70–99)
Potassium: 3.2 mmol/L — ABNORMAL LOW (ref 3.5–5.1)
Sodium: 138 mmol/L (ref 135–145)

## 2024-02-21 LAB — URINE CULTURE: Culture: NO GROWTH

## 2024-02-21 MED ORDER — POTASSIUM CHLORIDE CRYS ER 20 MEQ PO TBCR
40.0000 meq | EXTENDED_RELEASE_TABLET | Freq: Two times a day (BID) | ORAL | Status: DC
  Filled 2024-02-21: qty 2

## 2024-02-21 MED ORDER — VALACYCLOVIR HCL 1 G PO TABS: 1000.0000 mg | ORAL_TABLET | Freq: Three times a day (TID) | ORAL | 0 refills | Status: AC

## 2024-02-21 MED ORDER — AMOXICILLIN-POT CLAVULANATE 875-125 MG PO TABS: 1.0000 | ORAL_TABLET | Freq: Two times a day (BID) | ORAL | 0 refills | Status: AC

## 2024-02-21 MED ORDER — GABAPENTIN 300 MG PO CAPS: 300.0000 mg | ORAL_CAPSULE | Freq: Two times a day (BID) | ORAL | 0 refills | Status: AC

## 2024-02-21 NOTE — Plan of Care (Signed)

## 2024-02-22 NOTE — Discharge Summary (Signed)
 Physician Discharge Summary   Patient: Wendy Zimmerman MRN: [id number] DOB: 12-Jun-1961  Admit date:     02/19/2024  Discharge date: 02/21/2024  Discharge Physician: Wendy Zimmerman   PCP: Wendy Grim, MD   Recommendations at discharge:  Please follow up with PCP in one week.  Please  Obtain a mammogram in 2 weeks.    Discharge Diagnoses: Principal Problem:   Shingles Active Problems:   AKI (acute kidney injury) [facility])    Hospital Course: Wendy Zimmerman is a 63 y.o. female with medical history significant of Afib (on OAC), HTN, HLD, and OA p/w AKI and tender L breast rash c/w shingles.   US  breast shows Diffuse thickening of the skin of the LEFT periareolar region, most consistent with mastitis. No organized abscess is identified. Recommend repeat LEFT breast ultrasound and BILATERAL mammogram in approximately 2 weeks after the patient has completed antibiotic therapy.  Assessment and Plan:  Left sided mastitis Differential include shingles.  Pro calcitonin elevated. Restart antibiotics. Complete the course of antibiotics and follow up with PCP for a mammogram in 2 weeks.  Continue with valacyclovir  1 g for 7 days.  Continue with pain control.         AKI Suspect pre renal.  Resolved with IV fluids.    Hypokalemia Replaced.      Atrial fibrillation Rate controlled.  Continue with eliquis .      Hyperlipidemia Continue with crestor  and zetia .      Hypertension Well controlled.       Estimated body mass index is 40.92 kg/m as calculated from the following:   Height as of this encounter: 5\' 3"  (1.6 m).   Weight as of this encounter: 104.8 kg.     Consultants: none.  Procedures performed: US  breast  Disposition: Home Diet recommendation:  Regular diet DISCHARGE MEDICATION: Allergies as of 02/21/2024       Reactions   Ciprofloxacin     Other reaction(s): rash   Lisinopril Swelling, Other (See Comments)   Face and tongue swelling        Medication List      TAKE these medications    acetaminophen  500 MG tablet Commonly known as: TYLENOL  Take 500-1,000 mg by mouth every 6 (six) hours as needed for moderate pain (pain score 4-6).   amoxicillin -clavulanate 875-125 MG tablet Commonly known as: AUGMENTIN  Take 1 tablet by mouth 2 (two) times daily for 10 days.   apixaban  5 MG Tabs tablet Commonly known as: Eliquis  Take 1 tablet (5 mg total) by mouth 2 (two) times daily.   ezetimibe  10 MG tablet Commonly known as: ZETIA  TAKE 1 TABLET BY MOUTH EVERY DAY   gabapentin  300 MG capsule Commonly known as: NEURONTIN  Take 1 capsule (300 mg total) by mouth 2 (two) times daily for 5 days.   nitroGLYCERIN  0.4 MG SL tablet Commonly known as: NITROSTAT  Take 1 tablet wait 5 min, if Chest pain continues take 2nd dose, wait 5 min if CP continues take 3rd dose, wait 5 min if CP continues call 911   rosuvastatin  10 MG tablet Commonly known as: CRESTOR  TAKE 1 TABLET BY MOUTH EVERY DAY   valACYclovir  1000 MG tablet Commonly known as: VALTREX  Take 1 tablet (1,000 mg total) by mouth 3 (three) times daily for 5 days.   valsartan -hydrochlorothiazide  320-25 MG tablet Commonly known as: DIOVAN -HCT Take 1 tablet by mouth daily.        Follow-up Information     Zimmerman, Wendy Caprio, MD Follow up.  Specialty: Internal Medicine Contact information: [address] [place] [address] [phone]                Discharge Exam: Wendy Zimmerman Weights   02/19/24 0841  Weight: 104.8 kg   General exam: Appears calm and comfortable  Respiratory system: Clear to auscultation. Respiratory effort normal. Cardiovascular system: S1 & S2 heard, RRR. No JVD, Gastrointestinal system: Abdomen is nondistended, soft and nontender. Central nervous system: Alert and oriented. No focal neurological deficits. Extremities: Symmetric 5 x 5 power. Skin: erythematous left breast improving Psychiatry: Mood & affect appropriate.    Condition at  discharge: fair  The results of significant diagnostics from this hospitalization (including imaging, microbiology, ancillary and laboratory) are listed below for reference.   Imaging Studies: US  LIMITED ULTRASOUND INCLUDING AXILLA LEFT BREAST  Result Date: 02/19/2024 CLINICAL DATA:  63 year old woman with fever and nausea developed diffuse erythema and swelling of the LEFT periareolar region in the last 1-2 days, suspicious for mastitis and possible abscess. EXAM: ULTRASOUND OF THE LEFT BREAST COMPARISON:  Previous exam(s). FINDINGS: Targeted sonographic evaluation of the LEFT nipple and periareolar region demonstrates diffuse skin thickening without discrete fluid collection. IMPRESSION: Diffuse thickening of the skin of the LEFT periareolar region, most consistent with mastitis. No organized abscess is identified. RECOMMENDATION: Recommend repeat LEFT breast ultrasound and BILATERAL mammogram in approximately 2 weeks after the patient has completed antibiotic therapy. If patient's symptoms worsen, ultrasound should be performed earlier. I have discussed the findings and recommendations with the patient. If applicable, a reminder letter will be sent to the patient regarding the next appointment. BI-RADS CATEGORY  2: Benign. Electronically Signed   By: Wendy Zimmerman M.D.   On: 02/19/2024 16:12   DG Chest Portable 1 View Result Date: 02/19/2024 CLINICAL DATA:  Sepsis.  Fatigue and nausea.  Tachycardia.  Fever. EXAM: PORTABLE CHEST 1 VIEW COMPARISON:  10/22/2017 FINDINGS: The cardio pericardial silhouette is enlarged. Low lung volumes. Bibasilar atelectasis/infiltrate with probable small bilateral pleural effusions. Mild vascular congestion without overt edema. Telemetry leads overlie the chest. IMPRESSION: Low volume film with bibasilar atelectasis/infiltrate and probable small bilateral pleural effusions. Electronically Signed   By: Donnal Fusi M.D.   On: 02/19/2024 10:32   ECHOCARDIOGRAM  COMPLETE Result Date: 02/07/2024    ECHOCARDIOGRAM REPORT   Patient Name:   Wendy Zimmerman    Date of Exam: 02/07/2024 Medical Rec #:  [id number]     Height:       63.0 in Accession #:    [id number]    Weight:       234.0 lb Date of Birth:  01-21-1961      BSA:          2.067 m Patient Age:    63 years      BP:           112/80 mmHg Patient Gender: F             HR:           73 bpm. Exam Location:  [address] Procedure: 2D Echo, Cardiac Doppler, Color Doppler, 3D Echo and Strain Analysis            (Both Spectral and Color Flow Doppler were utilized during            procedure). Indications:    Atrial flutter I48.92  History:        Patient has no prior history of Echocardiogram examinations.  Cardiomyopathy, CAD, Arrythmias:Atrial Flutter; Risk                 Factors:Hypertension, Dyslipidemia and Morbid obesity.  Sonographer:    Lula Sale RDCS Referring Phys: [id number] [facility] A CHANDRASEKHAR IMPRESSIONS  1. Left ventricular ejection fraction, by estimation, is 55%. The left ventricle has normal function. The left ventricle has no regional wall motion abnormalities. Left ventricular diastolic parameters are consistent with Grade I diastolic dysfunction (impaired relaxation). The average left ventricular global longitudinal strain is -14.4 %. The global longitudinal strain is abnormal.  2. Right ventricular systolic function is normal. The right ventricular size is normal. There is normal pulmonary artery systolic pressure. The estimated right ventricular systolic pressure is 21.5 mmHg.  3. The mitral valve is normal in structure. Trivial mitral valve regurgitation. No evidence of mitral stenosis.  4. The aortic valve is tricuspid. There is mild calcification of the aortic valve. Aortic valve regurgitation is not visualized. No aortic stenosis is present.  5. The inferior vena cava is normal in size with greater than 50% respiratory variability, suggesting right atrial pressure of 3 mmHg. FINDINGS   Left Ventricle: Left ventricular ejection fraction, by estimation, is 55%. The left ventricle has normal function. The left ventricle has no regional wall motion abnormalities. The average left ventricular global longitudinal strain is -14.4 %. Strain was performed and the global longitudinal strain is abnormal. The left ventricular internal cavity size was normal in size. There is no left ventricular hypertrophy. Left ventricular diastolic parameters are consistent with Grade I diastolic dysfunction (impaired relaxation). Right Ventricle: The right ventricular size is normal. No increase in right ventricular wall thickness. Right ventricular systolic function is normal. There is normal pulmonary artery systolic pressure. The tricuspid regurgitant velocity is 2.15 m/s, and  with an assumed right atrial pressure of 3 mmHg, the estimated right ventricular systolic pressure is 21.5 mmHg. Left Atrium: Left atrial size was normal in size. Right Atrium: Right atrial size was normal in size. Pericardium: There is no evidence of pericardial effusion. Mitral Valve: The mitral valve is normal in structure. Mild mitral annular calcification. Trivial mitral valve regurgitation. No evidence of mitral valve stenosis. Tricuspid Valve: The tricuspid valve is normal in structure. Tricuspid valve regurgitation is trivial. Aortic Valve: The aortic valve is tricuspid. There is mild calcification of the aortic valve. Aortic valve regurgitation is not visualized. No aortic stenosis is present. Pulmonic Valve: The pulmonic valve was normal in structure. Pulmonic valve regurgitation is not visualized. Aorta: The aortic root is normal in size and structure. Venous: The inferior vena cava is normal in size with greater than 50% respiratory variability, suggesting right atrial pressure of 3 mmHg. IAS/Shunts: No atrial level shunt detected by color flow Doppler.  LEFT VENTRICLE PLAX 2D LVIDd:         4.40 cm   Diastology LVIDs:         2.90 cm    LV e' medial:    9.68 cm/s LV PW:         1.30 cm   LV E/e' medial:  9.6 LV IVS:        1.10 cm   LV e' lateral:   7.18 cm/s LVOT diam:     2.10 cm   LV E/e' lateral: 13.0 LV SV:         87 LV SV Index:   42        2D Longitudinal Strain LVOT Area:     3.46 cm  2D Strain GLS Avg:     -14.4 %  RIGHT VENTRICLE             IVC RV S prime:     12.90 cm/s  IVC diam: 1.50 cm TAPSE (M-mode): 2.0 cm RVSP:           21.5 mmHg LEFT ATRIUM           Index        RIGHT ATRIUM           Index LA diam:      4.50 cm 2.18 cm/m   RA Pressure: 3.00 mmHg LA Vol (A2C): 51.9 ml 25.11 ml/m  RA Area:     12.40 cm LA Vol (A4C): 66.7 ml 32.27 ml/m  RA Volume:   31.10 ml  15.05 ml/m  AORTIC VALVE LVOT Vmax:   101.00 cm/s LVOT Vmean:  73.100 cm/s LVOT VTI:    0.251 m  AORTA Ao Root diam: 3.10 cm Ao Asc diam:  3.30 cm MITRAL VALVE                TRICUSPID VALVE MV Area (PHT): 2.21 cm     TR Peak grad:   18.5 mmHg MV Decel Time: 343 msec     TR Vmax:        215.00 cm/s MV E velocity: 93.30 cm/s   Estimated RAP:  3.00 mmHg MV A velocity: 129.00 cm/s  RVSP:           21.5 mmHg MV E/A ratio:  0.72                             SHUNTS                             Systemic VTI:  0.25 m                             Systemic Diam: 2.10 cm Dalton McleanMD Electronically signed by Archer Bear Signature Date/Time: 02/07/2024/4:22:57 PM    Final    LONG TERM MONITOR (3-14 DAYS) Result Date: 01/28/2024   Patient had a minimum heart rate of 41 bpm, maximum heart rate of 204 bpm, and average heart rate of 82 bpm. Predominant underlying rhythm was sinus rhythm. Atrial flutter, 11% burden, average rate 103 bpm, longest duration 48 minutes. Multiple conversion pauses, longest lasting 4 seconds. Isolated PACs were occasional (1.5%). Isolated PVCs were rare (<1.0%). No triggered and diary events. Asymptomatic atrial flutter.   Microbiology: Results for orders placed or performed during the hospital encounter of 02/19/24  Culture, blood (routine x  2)     Status: None (Preliminary result)   Collection Time: 02/19/24  8:48 AM   Specimen: BLOOD LEFT FOREARM  Result Value Ref Range Status   Specimen Description BLOOD LEFT FOREARM  Final   Special Requests   Final    BOTTLES DRAWN AEROBIC AND ANAEROBIC Blood Culture adequate volume   Culture   Final    NO GROWTH 3 DAYS Performed at [facility], [address], [place], [place] [address]    Report Status PENDING  Incomplete  Resp panel by RT-PCR (RSV, Flu A&B, Covid) Anterior Nasal Swab     Status: None   Collection Time: 02/19/24  8:49 AM   Specimen: Anterior Nasal Swab  Result Value Ref  Range Status   SARS Coronavirus 2 by RT PCR NEGATIVE NEGATIVE Final   Influenza A by PCR NEGATIVE NEGATIVE Final   Influenza B by PCR NEGATIVE NEGATIVE Final    Comment: (NOTE) The Xpert Xpress SARS-CoV-2/FLU/RSV plus assay is intended as an aid in the diagnosis of influenza from Nasopharyngeal swab specimens and should not be used as a sole basis for treatment. Nasal washings and aspirates are unacceptable for Xpert Xpress SARS-CoV-2/FLU/RSV testing.  Fact Sheet for Patients: BloggerCourse.com  Fact Sheet for Healthcare Providers: SeriousBroker.it  This test is not yet approved or cleared by the [place]  FDA and has been authorized for detection and/or diagnosis of SARS-CoV-2 by FDA under an Emergency Use Authorization (EUA). This EUA will remain in effect (meaning this test can be used) for the duration of the COVID-19 declaration under Section 564(b)(1) of the Act, 21 U.S.C. section 360bbb-3(b)(1), unless the authorization is terminated or revoked.     Resp Syncytial Virus by PCR NEGATIVE NEGATIVE Final    Comment: (NOTE) Fact Sheet for Patients: BloggerCourse.com  Fact Sheet for Healthcare Providers: SeriousBroker.it  This test is not yet approved or cleared by the  [place]  FDA and has been authorized for detection and/or diagnosis of SARS-CoV-2 by FDA under an Emergency Use Authorization (EUA). This EUA will remain in effect (meaning this test can be used) for the duration of the COVID-19 declaration under Section 564(b)(1) of the Act, 21 U.S.C. section 360bbb-3(b)(1), unless the authorization is terminated or revoked.  Performed at [facility], [address], [place], [place] [address]   Culture, blood (routine x 2)     Status: None (Preliminary result)   Collection Time: 02/19/24  8:53 AM   Specimen: BLOOD  Result Value Ref Range Status   Specimen Description BLOOD RIGHT ANTECUBITAL  Final   Special Requests   Final    BOTTLES DRAWN AEROBIC AND ANAEROBIC Blood Culture adequate volume   Culture   Final    NO GROWTH 3 DAYS Performed at [facility], [address] [place], [place] [address]    Report Status PENDING  Incomplete  Urine Culture (for pregnant, neutropenic or urologic patients or patients with an indwelling urinary catheter)     Status: None   Collection Time: 02/20/24  6:35 PM   Specimen: Urine, Clean Catch  Result Value Ref Range Status   Specimen Description URINE, CLEAN CATCH  Final   Special Requests NONE  Final   Culture   Final    NO GROWTH Performed at [facility], [address] [place], [place] [address]    Report Status 02/21/2024 FINAL  Final    Labs: CBC: Recent Labs  Lab 02/19/24 0847 02/20/24 1048 02/21/24 0723  WBC 15.6* 12.8* 6.8  NEUTROABS 13.8* 10.3* 4.7  HGB 14.3 13.5 12.2  HCT 42.8 40.5 36.3  MCV 86.3 86.2 84.4  PLT 239 213 188   Basic Metabolic Panel: Recent Labs  Lab 02/19/24 0950 02/20/24 1048 02/21/24 0723  NA 139 138 138  K 3.4* 3.5 3.2*  CL 103 103 104  CO2 26 26 25   GLUCOSE 119* 116* 102*  BUN 13 12 10   CREATININE 1.31* 1.00 0.92  CALCIUM  8.0* 8.5* 8.1*   Liver Function Tests: Recent Labs  Lab 02/19/24 0950  AST 33  ALT 17  ALKPHOS 56   BILITOT 1.7*  PROT 6.6  ALBUMIN 3.2*   CBG: No results for input(s): "GLUCAP" in the last 168 hours.  Discharge time spent: 36 minutes  Signed: Feliciana Horn, MD [facility] 02/22/2024

## 2024-02-24 LAB — CULTURE, BLOOD (ROUTINE X 2)
Culture: NO GROWTH
Culture: NO GROWTH
Special Requests: ADEQUATE
Special Requests: ADEQUATE

## 2024-03-17 ENCOUNTER — Other Ambulatory Visit: Payer: Self-pay | Admitting: Internal Medicine

## 2024-03-17 DIAGNOSIS — N61 Mastitis without abscess: Secondary | ICD-10-CM

## 2024-03-29 ENCOUNTER — Other Ambulatory Visit: Payer: Self-pay | Admitting: Internal Medicine

## 2024-03-31 ENCOUNTER — Ambulatory Visit
Admission: RE | Admit: 2024-03-31 | Discharge: 2024-03-31 | Disposition: A | Discharge status: Home / Self Care | Source: Ambulatory Visit | Attending: Internal Medicine

## 2024-03-31 ENCOUNTER — Other Ambulatory Visit: Payer: Self-pay | Admitting: Internal Medicine

## 2024-03-31 DIAGNOSIS — N61 Mastitis without abscess: Secondary | ICD-10-CM

## 2024-04-14 ENCOUNTER — Ambulatory Visit (HOSPITAL_COMMUNITY)
Admission: RE | Admit: 2024-04-14 | Discharge: 2024-04-14 | Disposition: A | Discharge status: Home / Self Care | Source: Ambulatory Visit | Attending: Cardiology | Admitting: Cardiology

## 2024-04-14 DIAGNOSIS — I4892 Unspecified atrial flutter: Secondary | ICD-10-CM | POA: Insufficient documentation

## 2024-04-14 DIAGNOSIS — I10 Essential (primary) hypertension: Secondary | ICD-10-CM | POA: Insufficient documentation

## 2024-04-14 DIAGNOSIS — I480 Paroxysmal atrial fibrillation: Secondary | ICD-10-CM | POA: Diagnosis present

## 2024-04-14 MED ORDER — IOHEXOL 350 MG/ML SOLN
80.0000 mL | Freq: Once | INTRAVENOUS | Status: AC | PRN
  Administered 2024-04-14: 80 mL via INTRAVENOUS

## 2024-04-15 LAB — BASIC METABOLIC PANEL WITH GFR
BUN/Creatinine Ratio: 10 — ABNORMAL LOW (ref 12–28)
BUN: 9 mg/dL (ref 8–27)
CO2: 24 mmol/L (ref 20–29)
Calcium: 9.2 mg/dL (ref 8.7–10.3)
Chloride: 100 mmol/L (ref 96–106)
Creatinine, Ser: 0.92 mg/dL (ref 0.57–1.00)
Glucose: 97 mg/dL (ref 70–99)
Potassium: 3.8 mmol/L (ref 3.5–5.2)
Sodium: 140 mmol/L (ref 134–144)
eGFR: 69 mL/min/1.73 (ref >59)

## 2024-04-15 LAB — CBC
Hematocrit: 42.2 % (ref 34.0–46.6)
Hemoglobin: 14.2 g/dL (ref 11.1–15.9)
MCH: 29.7 pg (ref 26.6–33.0)
MCHC: 33.6 g/dL (ref 31.5–35.7)
MCV: 88 fL (ref 79–97)
Platelets: 289 x10E3/uL (ref 150–450)
RBC: 4.78 x10E6/uL (ref 3.77–5.28)
RDW: 13.8 % (ref 11.7–15.4)
WBC: 7.6 x10E3/uL (ref 3.4–10.8)

## 2024-04-30 ENCOUNTER — Telehealth (HOSPITAL_COMMUNITY): Payer: Self-pay

## 2024-04-30 NOTE — Telephone Encounter (Signed)
 Spoke with patient to discuss upcoming procedure.   CT: completed.  Labs: completed.   Any recent signs of acute illness or been started on antibiotics? Patient was admitted to [facility] 5/27-5/29/25 for acute mastitis of left breast and sepsis. She has since followed up with PCP and denies any s/o infection.  Any new medications started? No Any medications to hold?  No Any missed doses of blood thinner? No Advised patient to continue taking ANTICOAGULANT: Eliquis  (Apixaban ) twice daily without missing any doses.  Medication instructions:  On the morning of your procedure DO NOT take any medication., including Eliquis  or the procedure may be rescheduled. Nothing to eat or drink after midnight prior to your procedure.  Confirmed patient is scheduled for Atrial Fibrillation Ablation on Wednesday, August 13 with Dr. Ole Holts. Instructed patient to arrive at the Main Entrance A at [facility]: [address] [place], [place] [address] and check in at Admitting at 9:00 AM.  Advised of plan to go home the same day and will only stay overnight if medically necessary. You MUST have a responsible adult to drive you home and MUST be with you the first 24 hours after you arrive home or your procedure could be cancelled.  Patient verbalized understanding to all instructions provided and agreed to proceed with procedure.

## 2024-05-07 ENCOUNTER — Other Ambulatory Visit: Payer: Self-pay

## 2024-05-07 ENCOUNTER — Ambulatory Visit (HOSPITAL_COMMUNITY): Admitting: Anesthesiology

## 2024-05-07 ENCOUNTER — Encounter (HOSPITAL_COMMUNITY)
Admission: RE | Disposition: A | Discharge status: Home / Self Care | Payer: Self-pay | Source: Home / Self Care | Attending: Cardiology

## 2024-05-07 ENCOUNTER — Other Ambulatory Visit (HOSPITAL_COMMUNITY): Payer: Self-pay

## 2024-05-07 ENCOUNTER — Ambulatory Visit (HOSPITAL_COMMUNITY)
Admission: RE | Admit: 2024-05-07 | Discharge: 2024-05-07 | Disposition: A | Discharge status: Home / Self Care | Attending: Cardiology | Admitting: Cardiology

## 2024-05-07 DIAGNOSIS — I4819 Other persistent atrial fibrillation: Secondary | ICD-10-CM

## 2024-05-07 DIAGNOSIS — I2510 Atherosclerotic heart disease of native coronary artery without angina pectoris: Secondary | ICD-10-CM

## 2024-05-07 DIAGNOSIS — Z79899 Other long term (current) drug therapy: Secondary | ICD-10-CM | POA: Diagnosis not present

## 2024-05-07 DIAGNOSIS — I483 Typical atrial flutter: Secondary | ICD-10-CM

## 2024-05-07 DIAGNOSIS — E785 Hyperlipidemia, unspecified: Secondary | ICD-10-CM

## 2024-05-07 DIAGNOSIS — I10 Essential (primary) hypertension: Secondary | ICD-10-CM

## 2024-05-07 DIAGNOSIS — I4891 Unspecified atrial fibrillation: Secondary | ICD-10-CM

## 2024-05-07 DIAGNOSIS — Z7901 Long term (current) use of anticoagulants: Secondary | ICD-10-CM | POA: Diagnosis not present

## 2024-05-07 HISTORY — PX: ATRIAL FIBRILLATION ABLATION: EP1191

## 2024-05-07 LAB — POCT ACTIVATED CLOTTING TIME: Activated Clotting Time: 325 s

## 2024-05-07 SURGERY — ATRIAL FIBRILLATION ABLATION
Anesthesia: General

## 2024-05-07 MED ORDER — SUGAMMADEX SODIUM 200 MG/2ML IV SOLN
INTRAVENOUS | Status: DC | PRN
  Administered 2024-05-07 (×2): 220 mg via INTRAVENOUS

## 2024-05-07 MED ORDER — PANTOPRAZOLE SODIUM 40 MG PO TBEC
40.0000 mg | DELAYED_RELEASE_TABLET | Freq: Every day | ORAL | 0 refills | Status: DC
  Filled 2024-05-07: qty 45, 45d supply, fill #0

## 2024-05-07 MED ORDER — PHENYLEPHRINE HCL-NACL 20-0.9 MG/250ML-% IV SOLN
INTRAVENOUS | Status: DC | PRN
  Administered 2024-05-07 (×2): 20 ug/min via INTRAVENOUS

## 2024-05-07 MED ORDER — SODIUM CHLORIDE 0.9 % IV SOLN: 250.0000 mL | INTRAVENOUS | Status: DC | PRN

## 2024-05-07 MED ORDER — ACETAMINOPHEN 500 MG PO TABS
ORAL_TABLET | ORAL | Status: AC
  Filled 2024-05-07: qty 2

## 2024-05-07 MED ORDER — PHENYLEPHRINE 80 MCG/ML (10ML) SYRINGE FOR IV PUSH (FOR BLOOD PRESSURE SUPPORT)
PREFILLED_SYRINGE | INTRAVENOUS | Status: DC | PRN
  Administered 2024-05-07: 80 ug via INTRAVENOUS
  Administered 2024-05-07 (×5): 160 ug via INTRAVENOUS
  Administered 2024-05-07: 200 ug via INTRAVENOUS
  Administered 2024-05-07 (×5): 160 ug via INTRAVENOUS
  Administered 2024-05-07: 80 ug via INTRAVENOUS
  Administered 2024-05-07 (×2): 160 ug via INTRAVENOUS
  Administered 2024-05-07: 200 ug via INTRAVENOUS

## 2024-05-07 MED ORDER — COLCHICINE 0.6 MG PO TABS
0.6000 mg | ORAL_TABLET | Freq: Two times a day (BID) | ORAL | 0 refills | Status: DC
  Filled 2024-05-07: qty 10, 5d supply, fill #0

## 2024-05-07 MED ORDER — ROCURONIUM BROMIDE 10 MG/ML (PF) SYRINGE
PREFILLED_SYRINGE | INTRAVENOUS | Status: DC | PRN
  Administered 2024-05-07 (×2): 5 mg via INTRAVENOUS
  Administered 2024-05-07 (×2): 60 mg via INTRAVENOUS

## 2024-05-07 MED ORDER — COLCHICINE 0.6 MG PO TABS
0.6000 mg | ORAL_TABLET | Freq: Two times a day (BID) | ORAL | Status: DC
  Administered 2024-05-07 (×2): 0.6 mg via ORAL
  Filled 2024-05-07: qty 1

## 2024-05-07 MED ORDER — LIDOCAINE 2% (20 MG/ML) 5 ML SYRINGE
INTRAMUSCULAR | Status: DC | PRN
  Administered 2024-05-07 (×2): 100 mg via INTRAVENOUS

## 2024-05-07 MED ORDER — EPHEDRINE SULFATE-NACL 50-0.9 MG/10ML-% IV SOSY
PREFILLED_SYRINGE | INTRAVENOUS | Status: DC | PRN
  Administered 2024-05-07 (×2): 5 mg via INTRAVENOUS

## 2024-05-07 MED ORDER — ACETAMINOPHEN 325 MG PO TABS: 650.0000 mg | ORAL_TABLET | ORAL | Status: DC | PRN

## 2024-05-07 MED ORDER — FENTANYL CITRATE (PF) 100 MCG/2ML IJ SOLN
INTRAMUSCULAR | Status: AC
  Filled 2024-05-07: qty 2

## 2024-05-07 MED ORDER — ONDANSETRON HCL 4 MG/2ML IJ SOLN
INTRAMUSCULAR | Status: DC | PRN
  Administered 2024-05-07 (×2): 4 mg via INTRAVENOUS

## 2024-05-07 MED ORDER — ATROPINE SULFATE 1 MG/10ML IJ SOSY
PREFILLED_SYRINGE | INTRAMUSCULAR | Status: DC | PRN
  Administered 2024-05-07 (×2): 1 mg via INTRAVENOUS

## 2024-05-07 MED ORDER — PROTAMINE SULFATE 10 MG/ML IV SOLN
INTRAVENOUS | Status: DC | PRN
  Administered 2024-05-07 (×2): 35 mg via INTRAVENOUS

## 2024-05-07 MED ORDER — APIXABAN 5 MG PO TABS
5.0000 mg | ORAL_TABLET | Freq: Two times a day (BID) | ORAL | Status: DC
  Administered 2024-05-07 (×2): 5 mg via ORAL
  Filled 2024-05-07: qty 1

## 2024-05-07 MED ORDER — HEPARIN (PORCINE) IN NACL 1000-0.9 UT/500ML-% IV SOLN
INTRAVENOUS | Status: DC | PRN
Start: 2024-05-07 — End: 2024-05-07
  Administered 2024-05-07 (×6): 500 mL

## 2024-05-07 MED ORDER — ONDANSETRON HCL 4 MG/2ML IJ SOLN: 4.0000 mg | Freq: Four times a day (QID) | INTRAMUSCULAR | Status: DC | PRN

## 2024-05-07 MED ORDER — FENTANYL CITRATE (PF) 100 MCG/2ML IJ SOLN
25.0000 ug | INTRAMUSCULAR | Status: DC | PRN
  Administered 2024-05-07 (×2): 50 ug via INTRAVENOUS

## 2024-05-07 MED ORDER — SODIUM CHLORIDE 0.9% FLUSH: 3.0000 mL | Freq: Two times a day (BID) | INTRAVENOUS | Status: DC

## 2024-05-07 MED ORDER — MIDAZOLAM HCL 2 MG/2ML IJ SOLN
INTRAMUSCULAR | Status: AC
  Filled 2024-05-07: qty 2

## 2024-05-07 MED ORDER — SODIUM CHLORIDE 0.9% FLUSH
3.0000 mL | INTRAVENOUS | Status: DC | PRN
Start: 2024-05-07 — End: 2024-05-07

## 2024-05-07 MED ORDER — FENTANYL CITRATE (PF) 250 MCG/5ML IJ SOLN
INTRAMUSCULAR | Status: DC | PRN
  Administered 2024-05-07 (×2): 100 ug via INTRAVENOUS

## 2024-05-07 MED ORDER — PANTOPRAZOLE SODIUM 40 MG PO TBEC
40.0000 mg | DELAYED_RELEASE_TABLET | Freq: Every day | ORAL | Status: DC
  Administered 2024-05-07 (×2): 40 mg via ORAL
  Filled 2024-05-07: qty 1

## 2024-05-07 MED ORDER — SODIUM CHLORIDE 0.9 % IV SOLN: INTRAVENOUS | Status: DC

## 2024-05-07 MED ORDER — HEPARIN SODIUM (PORCINE) 1000 UNIT/ML IJ SOLN
INTRAMUSCULAR | Status: DC | PRN
  Administered 2024-05-07 (×2): 1000 [IU] via INTRAVENOUS

## 2024-05-07 MED ORDER — ACETAMINOPHEN 325 MG PO TABS
ORAL_TABLET | ORAL | Status: AC
  Filled 2024-05-07: qty 2

## 2024-05-07 MED ORDER — PROPOFOL 10 MG/ML IV BOLUS
INTRAVENOUS | Status: DC | PRN
  Administered 2024-05-07 (×2): 140 mg via INTRAVENOUS

## 2024-05-07 MED ORDER — FENTANYL CITRATE (PF) 100 MCG/2ML IJ SOLN
INTRAMUSCULAR | Status: AC
  Administered 2024-05-07 (×2): 25 ug via INTRAVENOUS
  Filled 2024-05-07: qty 2

## 2024-05-07 MED ORDER — DEXAMETHASONE SODIUM PHOSPHATE 10 MG/ML IJ SOLN
INTRAMUSCULAR | Status: DC | PRN
  Administered 2024-05-07 (×2): 5 mg via INTRAVENOUS

## 2024-05-07 MED ORDER — HEPARIN SODIUM (PORCINE) 1000 UNIT/ML IJ SOLN
INTRAMUSCULAR | Status: DC | PRN
  Administered 2024-05-07 (×2): 16000 [IU] via INTRAVENOUS

## 2024-05-07 MED ORDER — ACETAMINOPHEN 500 MG PO TABS
1000.0000 mg | ORAL_TABLET | Freq: Once | ORAL | Status: AC
  Administered 2024-05-07 (×2): 1000 mg via ORAL

## 2024-05-07 MED ORDER — MIDAZOLAM HCL 2 MG/2ML IJ SOLN
INTRAMUSCULAR | Status: DC | PRN
  Administered 2024-05-07 (×2): 2 mg via INTRAVENOUS

## 2024-05-07 MED ORDER — HEPARIN SODIUM (PORCINE) 1000 UNIT/ML IJ SOLN
INTRAMUSCULAR | Status: AC
  Filled 2024-05-07: qty 10

## 2024-05-07 SURGICAL SUPPLY — 22 items
BAG SNAP BAND KOVER 36X36 (MISCELLANEOUS) IMPLANT
BLANKET WARM UNDERBOD FULL ACC (MISCELLANEOUS) ×1 IMPLANT
CABLE FARASTAR GEN2 SNGL USE (CABLE) IMPLANT
CATH ABLAT QDOT MICRO BI TC DF (CATHETERS) IMPLANT
CATH FARAWAVE 2.0 31 (CATHETERS) IMPLANT
CATH GE 8FR SOUNDSTAR (CATHETERS) IMPLANT
CATH OCTARAY 2.0 F 3-3-3-3-3 (CATHETERS) IMPLANT
CATH WEBSTER BI DIR CS D-F CRV (CATHETERS) IMPLANT
CLOSURE PERCLOSE PROSTYLE (VASCULAR PRODUCTS) IMPLANT
COVER SWIFTLINK CONNECTOR (BAG) ×1 IMPLANT
DILATOR VESSEL 38 20CM 16FR (INTRODUCER) IMPLANT
GUIDEWIRE INQWIRE 1.5J.035X260 (WIRE) IMPLANT
KIT VERSACROSS CNCT FARADRIVE (KITS) IMPLANT
MAT PREVALON FULL STRYKER (MISCELLANEOUS) IMPLANT
PACK EP LF (CUSTOM PROCEDURE TRAY) ×1 IMPLANT
PAD DEFIB RADIO PHYSIO CONN (PAD) ×1 IMPLANT
PATCH CARTO3 (PAD) IMPLANT
SHEATH FARADRIVE STEERABLE (SHEATH) IMPLANT
SHEATH PINNACLE 8F 10CM (SHEATH) IMPLANT
SHEATH PINNACLE 9F 10CM (SHEATH) IMPLANT
SHEATH PROBE COVER 6X72 (BAG) IMPLANT
TUBING SMART ABLATE COOLFLOW (TUBING) IMPLANT

## 2024-05-07 NOTE — Anesthesia Preprocedure Evaluation (Addendum)
 Anesthesia Evaluation  Patient identified by MRN, date of birth, ID band Patient awake    Reviewed: Allergy & Precautions, NPO status , Patient's Chart, lab work & pertinent test results  History of Anesthesia Complications (+) PONV and history of anesthetic complications  Airway Mallampati: III  TM Distance: <3 FB Neck ROM: Full    Dental  (+) Dental Advisory Given, Teeth Intact   Pulmonary neg pulmonary ROS   Pulmonary exam normal breath sounds clear to auscultation       Cardiovascular hypertension, Pt. on medications + CAD   Rhythm:Regular Rate:Normal  Echo 01/2024  1. Left ventricular ejection fraction, by estimation, is 55%. The left  ventricle has normal function. The left ventricle has no regional wall  motion abnormalities. Left ventricular diastolic parameters are consistent  with Grade I diastolic dysfunction (impaired relaxation). The average left ventricular global longitudinal strain is -14.4 %. The global longitudinal strain is abnormal.   2. Right ventricular systolic function is normal. The right ventricular  size is normal. There is normal pulmonary artery systolic pressure. The  estimated right ventricular systolic pressure is 21.5 mmHg.   3. The mitral valve is normal in structure. Trivial mitral valve regurgitation.  No evidence of mitral stenosis.   4. The aortic valve is tricuspid. There is mild calcification of the  aortic valve. Aortic valve regurgitation is not visualized. No aortic  stenosis is present.   5. The inferior vena cava is normal in size with greater than 50%  respiratory variability, suggesting right atrial pressure of 3 mmHg.    Coronary CT 2022 The best systolic and diastolic phases of the patients gated cardiac CTA sent to HeartFlow for hemodynamic analysis   FINDINGS: Normal FFR CT in LM, RCA and Circumflex   LAD FFR CT: 0.96 proximally 0.89 in mid vessel 0.81 in most  distal segment   IMPRESSION: Abnormal FFR CT in LAD However only approaches obstructive significance in most distal segment at 0.81     Neuro/Psych negative neurological ROS     GI/Hepatic negative GI ROS, Neg liver ROS,,,  Endo/Other  negative endocrine ROS    Renal/GU Renal disease     Musculoskeletal  (+) Arthritis ,    Abdominal  (+) + obese  Peds  Hematology negative hematology ROS (+)   Anesthesia Other Findings   Reproductive/Obstetrics                              Anesthesia Physical Anesthesia Plan  ASA: 3  Anesthesia Plan: General   Post-op Pain Management: Tylenol  PO (pre-op)*   Induction: Intravenous  PONV Risk Score and Plan: 4 or greater and Ondansetron , Dexamethasone , Treatment may vary due to age or medical condition and Propofol  infusion  Airway Management Planned: Oral ETT and Video Laryngoscope Planned  Additional Equipment:   Intra-op Plan:   Post-operative Plan: Extubation in OR  Informed Consent: I have reviewed the patients History and Physical, chart, labs and discussed the procedure including the risks, benefits and alternatives for the proposed anesthesia with the patient or authorized representative who has indicated his/her understanding and acceptance.     Dental advisory given  Plan Discussed with: CRNA  Anesthesia Plan Comments:          Anesthesia Quick Evaluation

## 2024-05-07 NOTE — Anesthesia Postprocedure Evaluation (Signed)
 Anesthesia Post Note  Patient: Wendy Zimmerman  Procedure(s) Performed: ATRIAL FIBRILLATION ABLATION     Patient location during evaluation: PACU Anesthesia Type: General Level of consciousness: sedated and patient cooperative Pain management: pain level controlled Vital Signs Assessment: post-procedure vital signs reviewed and stable Respiratory status: spontaneous breathing Cardiovascular status: stable Anesthetic complications: no   There were no known notable events for this encounter.  Last Vitals:  Vitals:   05/07/24 1400 05/07/24 1431  BP: 106/74 108/78  Pulse: 81 83  Resp: 14 16  Temp:    SpO2: 92% 92%    Last Pain:  Vitals:   05/07/24 1340  TempSrc:   PainSc: 8                  Liylah Najarro [employer]

## 2024-05-07 NOTE — H&P (Signed)
 Electrophysiology Office Note:     Date:  05/07/2024    ID:  Wendy Zimmerman, DOB 1960/10/01, MRN [id number]   [facility] Cardiologist:  Stanly DELENA Leavens, MD  [facility] Electrophysiologist:  OLE ONEIDA HOLTS, MD    Referring MD: Leavens Stanly DELENA*    Chief Complaint: Atrial flutter   History of Present Illness:       Wendy Zimmerman is a 63 year old woman who I am seeing today for an evaluation of atrial flutter at the request of Dr. Leavens.  This was identified by her primary care physician and she was started on anticoagulation.   The patient describes fatigue that she associates with her atrial fibrillation.  She does not appreciate the palpitations.  No lightheadedness or dizziness.  She is taking Eliquis  twice daily for stroke prophylaxis.  She is interested in avoiding medical therapy for her atrial fibrillation and would prefer an invasive approach.  She has done some reading on catheter ablation prior to today's visit.  Presents for AF ablation. Procedure reviewed.  Objective Their past medical, social and family history was reviewed.     ROS:   Please see the history of present illness.    All other systems reviewed and are negative.   EKGs/Labs/Other Studies Reviewed:     The following studies were reviewed today:   Feb 07, 2024 echo EF 55% RV normal No significant valve abnormalities   Jan 28, 2024 ZIO monitor personally reviewed 11% burden of atrial fibrillation and flutter, average rate 103 bpm, Conversion pauses lasting up to 4 seconds Occasional PACs Rare PVCs   January 04, 2024 EKG shows sinus rhythm with PAC.              Physical Exam:     VS:  BP 124/76   Pulse 95   Ht 5' 3 (1.6 m)   Wt 234 lb (106.1 kg)   SpO2 95%   BMI 41.45 kg/m         Wt Readings from Last 3 Encounters:  02/14/24 234 lb (106.1 kg)  01/04/24 234 lb (106.1 kg)  12/15/22 239 lb 3.2 oz (108.5 kg)      GEN: no distress CARD: RRR, No MRG RESP: No  IWOB. CTAB.         Assessment ASSESSMENT AND PLAN:     1. Paroxysmal atrial fibrillation (HCC)   2. Primary hypertension   3. Atrial flutter, unspecified type (HCC)       #Atrial fibrillation and flutter Burden 11%.  Associated with postconversion pauses. I did treatment options for her atrial fibrillation flutter during today's clinic appointment.  We discussed conservative management, antiarrhythmic drugs and catheter ablation.   Discussed treatment options today for AF including antiarrhythmic drug therapy and ablation. Discussed risks, recovery and likelihood of success with each treatment strategy. Risk, benefits, and alternatives to EP study and ablation for afib were discussed. These risks include but are not limited to stroke, bleeding, vascular damage, tamponade, perforation, damage to the esophagus, lungs, phrenic nerve and other structures, pulmonary vein stenosis, worsening renal function, coronary vasospasm and death.  Discussed potential need for repeat ablation procedures and antiarrhythmic drugs after an initial ablation. The patient understands these risk and wishes to proceed.  We will therefore proceed with catheter ablation at the next available time.  Carto, ICE, anesthesia are requested for the procedure.  Will also obtain CT PV protocol prior to the procedure to exclude LAA thrombus and further evaluate atrial anatomy.  Strategy would be PVI plus posterior wall plus CTI.        Presents for AF ablation. Procedure reviewed.       Signed, Ole DASEN. Cindie, MD, [facility], [facility] 05/07/2024 Electrophysiology [facility]

## 2024-05-07 NOTE — Progress Notes (Addendum)
 Patient walked to the bathroom without difficulties. Bilateral groins level 0, clean, dry, and intact.

## 2024-05-07 NOTE — Discharge Instructions (Signed)

## 2024-05-07 NOTE — Anesthesia Procedure Notes (Addendum)
 Procedure Name: Intubation Date/Time: 05/07/2024 10:37 AM  Performed by: Vera Rochele PARAS, CRNAPre-anesthesia Checklist: Patient identified, Emergency Drugs available, Suction available and Patient being monitored Patient Re-evaluated:Patient Re-evaluated prior to induction Oxygen Delivery Method: Circle System Utilized Preoxygenation: Pre-oxygenation with 100% oxygen Induction Type: IV induction Ventilation: Mask ventilation without difficulty and Oral airway inserted - appropriate to patient size Laryngoscope Size: Glidescope and 3 Grade View: Grade I Tube type: Oral Tube size: 7.0 mm Number of attempts: 1 Airway Equipment and Method: Stylet and Oral airway Placement Confirmation: ETT inserted through vocal cords under direct vision, positive ETCO2 and breath sounds checked- equal and bilateral Secured at: 23 cm Tube secured with: Tape Dental Injury: Teeth and Oropharynx as per pre-operative assessment

## 2024-05-07 NOTE — Transfer of Care (Signed)
 Immediate Anesthesia Transfer of Care Note  Patient: Wendy Zimmerman  Procedure(s) Performed: ATRIAL FIBRILLATION ABLATION  Patient Location: PACU  Anesthesia Type:General  Level of Consciousness: awake, alert , and oriented  Airway & Oxygen Therapy: Patient Spontanous Breathing and Patient connected to face mask oxygen  Post-op Assessment: Report given to RN and Post -op Vital signs reviewed and stable  Post vital signs: Reviewed and stable  Last Vitals:  Vitals Value Taken Time  BP    Temp    Pulse    Resp    SpO2      Last Pain:  Vitals:   05/07/24 0927  TempSrc: Oral         Complications: There were no known notable events for this encounter.

## 2024-05-08 ENCOUNTER — Encounter (HOSPITAL_COMMUNITY): Payer: Self-pay | Admitting: Cardiology

## 2024-05-08 ENCOUNTER — Telehealth (HOSPITAL_COMMUNITY): Payer: Self-pay

## 2024-05-08 MED FILL — Fentanyl Citrate Preservative Free (PF) Inj 100 MCG/2ML: INTRAMUSCULAR | Qty: 2 | Status: AC

## 2024-05-08 NOTE — Telephone Encounter (Signed)
 Spoke with patient to complete post procedure follow up call.  Patient reports no complications with groin sites.   Instructions reviewed with patient:  Remove large bandage at puncture site after 24 hours. It is normal to have bruising, tenderness, mild swelling, and a pea or marble sized lump/knot at the groin site which can take up to three months to resolve.  Get help right away if you notice sudden swelling at the puncture site.  Check your puncture site every day for signs of infection: fever, redness, swelling, pus drainage, warmth, foul odor or excessive pain. If this occurs, please call the office at [phone], to speak with the nurse. Get help right away if your puncture site is bleeding and the bleeding does not stop after applying firm pressure to the area.  You may continue to have skipped beats/ atrial fibrillation during the first several months after your procedure.  It is very important not to miss any doses of your blood thinner Eliquis .   You will follow up with the Afib clinic on 06/04/24 and follow up with the APP on 08/05/24.   Patient verbalized understanding to all instructions provided.

## 2024-05-09 ENCOUNTER — Other Ambulatory Visit: Payer: Self-pay | Admitting: Internal Medicine

## 2024-05-09 DIAGNOSIS — Z1231 Encounter for screening mammogram for malignant neoplasm of breast: Secondary | ICD-10-CM

## 2024-05-12 ENCOUNTER — Encounter: Payer: Self-pay | Admitting: Cardiology

## 2024-06-04 ENCOUNTER — Encounter (HOSPITAL_COMMUNITY): Payer: Self-pay | Admitting: Internal Medicine

## 2024-06-04 ENCOUNTER — Ambulatory Visit (HOSPITAL_COMMUNITY)
Admission: RE | Admit: 2024-06-04 | Discharge: 2024-06-04 | Disposition: A | Discharge status: Home / Self Care | Source: Ambulatory Visit | Attending: Internal Medicine | Admitting: Internal Medicine

## 2024-06-04 VITALS — BP 116/78 | HR 105 | Ht 63.0 in | Wt 239.4 lb

## 2024-06-04 DIAGNOSIS — I480 Paroxysmal atrial fibrillation: Secondary | ICD-10-CM | POA: Diagnosis not present

## 2024-06-04 DIAGNOSIS — D6869 Other thrombophilia: Secondary | ICD-10-CM | POA: Diagnosis not present

## 2024-06-04 NOTE — Progress Notes (Addendum)
 Primary Care Physician: Vernon Velna SAUNDERS, MD Primary Cardiologist: Stanly DELENA Leavens, MD Electrophysiologist: OLE ONEIDA HOLTS, MD     Referring Physician: Dr. HOLTS Wendy Zimmerman is a 63 y.o. female with a history of HTN, HLD, aortic atherosclerosis, CAD, and atrial fibrillation/flutter who presents for consultation in the [facility]. Patient is on Eliquis  for a CHADS2VASC score of 4.  On evaluation today, patient is currently in NSR. S/p Afib/flutter ablation on by Dr. HOLTS. No episodes of Afib since ablation. She still notes fatigue ongoing. No chest pain or SOB. Leg sites healed without issue. No missed doses of anticoagulant.  Today, she denies symptoms of orthopnea, PND, lower extremity edema, dizziness, presyncope, syncope, snoring, daytime somnolence, bleeding, or neurologic sequela. The patient is tolerating medications without difficulties and is otherwise without complaint today.    she has a BMI of Body mass index is 42.41 kg/m.SABRA Filed Weights   06/04/24 1505  Weight: 108.6 kg    Current Outpatient Medications  Medication Sig Dispense Refill   acetaminophen  (TYLENOL ) 500 MG tablet Take 500-1,000 mg by mouth every 6 (six) hours as needed for moderate pain (pain score 4-6).     apixaban  (ELIQUIS ) 5 MG TABS tablet Take 1 tablet (5 mg total) by mouth 2 (two) times daily. 180 tablet 2   ezetimibe  (ZETIA ) 10 MG tablet TAKE 1 TABLET BY MOUTH EVERY DAY 90 tablet 3   gabapentin  (NEURONTIN ) 300 MG capsule Take 1 capsule (300 mg total) by mouth 2 (two) times daily for 5 days. (Patient taking differently: Take 300 mg by mouth 2 (two) times daily as needed (Knee pain).) 10 capsule 0   nitroGLYCERIN  (NITROSTAT ) 0.4 MG SL tablet Take 1 tablet wait 5 min, if Chest pain continues take 2nd dose, wait 5 min if CP continues take 3rd dose, wait 5 min if CP continues call 911 25 tablet 3   rosuvastatin  (CRESTOR ) 10 MG tablet TAKE 1 TABLET BY MOUTH  EVERY DAY 90 tablet 3   valsartan -hydrochlorothiazide  (DIOVAN -HCT) 320-25 MG tablet Take 1 tablet by mouth daily.     No current facility-administered medications for this encounter.    Atrial Fibrillation Management history:  Previous antiarrhythmic drugs: none Previous cardioversions:  Previous ablations: 05/07/24 Anticoagulation history: Eliquis    ROS- All systems are reviewed and negative except as per the HPI above.  Physical Exam: BP 116/78   Pulse (!) 105   Ht 5' 3 (1.6 m)   Wt 108.6 kg   BMI 42.41 kg/m   GEN: Well nourished, well developed in no acute distress NECK: No JVD; No carotid bruits CARDIAC: Regular rate and rhythm, no murmurs, rubs, gallops RESPIRATORY:  Clear to auscultation without rales, wheezing or rhonchi  ABDOMEN: Soft, non-tender, non-distended EXTREMITIES:  No edema; No deformity   EKG today demonstrates  Vent. rate 105 BPM PR interval 182 ms QRS duration 94 ms QT/QTcB 354/467 ms P-R-T axes 45 40 3 Sinus tachycardia Cannot rule out Anterior infarct , age undetermined Abnormal ECG When compared with ECG of 07-May-2024 12:19, Previous ECG is present  Echo 02/07/24 demonstrated  1. Left ventricular ejection fraction, by estimation, is 55%. The left  ventricle has normal function. The left ventricle has no regional wall  motion abnormalities. Left ventricular diastolic parameters are consistent  with Grade I diastolic dysfunction  (impaired relaxation). The average left ventricular global longitudinal  strain is -14.4 %. The global longitudinal strain is abnormal.   2. Right  ventricular systolic function is normal. The right ventricular  size is normal. There is normal pulmonary artery systolic pressure. The  estimated right ventricular systolic pressure is 21.5 mmHg.   3. The mitral valve is normal in structure. Trivial mitral valve  regurgitation. No evidence of mitral stenosis.   4. The aortic valve is tricuspid. There is mild  calcification of the  aortic valve. Aortic valve regurgitation is not visualized. No aortic  stenosis is present.   5. The inferior vena cava is normal in size with greater than 50%  respiratory variability, suggesting right atrial pressure of 3 mmHg.   ASSESSMENT & PLAN CHA2DS2-VASc Score = 4  The patient's score is based upon: CHF History: 0 HTN History: 1 Diabetes History: 0 Stroke History: 0 Vascular Disease History: 1 Age Score: 1 Gender Score: 1       ASSESSMENT AND PLAN: Paroxsymal Atrial Fibrillation (ICD10:  I48.19) The patient's CHA2DS2-VASc score is 4, indicating a 4.8% annual risk of stroke.   S/p Afib/flutter ablation on 05/07/24 by Dr. Cindie.  Patient is currently in NSR. She is stable overall and will continue with current management without change.   Secondary Hypercoagulable State (ICD10:  D68.69) The patient is at significant risk for stroke/thromboembolism based upon her CHA2DS2-VASc Score of 4.  Continue Apixaban  (Eliquis ).  Continue Eliquis .    Follow up with EP as scheduled.    Terra Pac, [facility]  Afib Clinic [address] [place], [place] [address] [phone]

## 2024-06-11 ENCOUNTER — Other Ambulatory Visit (HOSPITAL_COMMUNITY): Payer: Self-pay

## 2024-06-16 ENCOUNTER — Ambulatory Visit
Admission: RE | Admit: 2024-06-16 | Discharge: 2024-06-16 | Disposition: A | Discharge status: Home / Self Care | Source: Ambulatory Visit | Attending: Internal Medicine

## 2024-06-16 DIAGNOSIS — Z1231 Encounter for screening mammogram for malignant neoplasm of breast: Secondary | ICD-10-CM

## 2024-07-31 NOTE — Progress Notes (Signed)
  Cardiology Office Note:  .   Date:  07/31/2024  ID:  Wendy Zimmerman, DOB [birth year], MRN [id number] PCP: Vernon Velna SAUNDERS, MD  [facility] Providers Cardiologist:  Stanly DELENA Leavens, MD Electrophysiologist:  OLE ONEIDA HOLTS, MD {  History of Present Illness: .   Wendy Zimmerman is a 63 y.o. female w/PMHx of  HTN, HLD AFlutter AFib  Mild Non obstructive CAD reported by CT 2022  She was referred to EP for management of AFlutter, saw Dr. Holts 02/14/24, she preferred to avoid additional medications, planned for ablation   Underwent ablation 05/07/24 (AFib and CTI)  She saw the Afib clinic as usual 06/04/24, no procedural concerns, no symptoms of arrhythmia   Today's visit is scheduled as her post ablation visit ROS:   She feels well Has bone on bone knee pain b/l that limits her, so no formal exercise No CP, palpitations, no SOB No near syncope or syncope No bleeding or signs of bleeding  She was unaware of her Afib when found, so is uncertain she would know if she has had any, though feels well, with no suspicion of arrhythmia   Arrhythmia/AAD hx AFib/fFlutter noted April 2025 AFib and CTI ablation 05/07/24  Studies Reviewed: SABRA    EKG done today and reviewed by myself:  SR 95bpm, some baseline noise/motion   05/07/24: EPS/ablation CONCLUSIONS: 1. Successful PVI 2. Successful ablation/isolation of the posterior wall 3.  Successful cavotricuspid isthmus ablation for typical atrial flutter 4. Intracardiac echo reveals trivial pericardial effusion, normal left atrial architecture 5. No early apparent complications. 6. Colchicine  0.6mg  PO BID x 5 days 7. Protonix  40mg  PO daily x 45 days   04/15/24: cardiac CT IMPRESSION: 1. There is normal pulmonary vein drainage into the left atrium. 2. There is no thrombus in the left atrial appendage. 3. The esophagus runs in the left atrial midline and is not in proximity to any of the pulmonary vein ostia. 4. No  PFO/ASD. 5. CAC score of 513 which is 96 percentile for age-, race-, and sex-matched controls.  Feb 07, 2024 echo EF 55% RV normal No significant valve abnormalities   Jan 28, 2024 ZIO monitor personally reviewed 11% burden of atrial fibrillation and flutter, average rate 103 bpm, Conversion pauses lasting up to 4 seconds Occasional PACs Rare PVCs   January 04, 2024 EKG shows sinus rhythm with PAC.   Risk Assessment/Calculations:    Physical Exam:   VS:  There were no vitals taken for this visit.   Wt Readings from Last 3 Encounters:  06/04/24 239 lb 6.4 oz (108.6 kg)  05/07/24 234 lb (106.1 kg)  02/19/24 231 lb (104.8 kg)    GEN: Well nourished, well developed in no acute distress NECK: No JVD; No carotid bruits CARDIAC: RRR, no murmurs, rubs, gallops RESPIRATORY:  CTA b/l without rales, wheezing or rhonchi  ABDOMEN: Soft, non-tender, non-distended EXTREMITIES: No edema; No deformity   ASSESSMENT AND PLAN: .    paroxysmal AFib AFlutter CHA2DS2Vasc is 3, on Eliquis , appropriately dosed  No symptoms Feels well post ablation   HTN Looks good  Secondary hypercoagulable state 2/2 AFib  Discussed Dr. Hiram pending relocation/departure. She will follw up with Dr. Leavens as planned (~April), and EP PRN   Signed, Charlies Macario Arthur, PA-C

## 2024-08-05 ENCOUNTER — Ambulatory Visit: Attending: Physician Assistant | Admitting: Physician Assistant

## 2024-08-05 VITALS — BP 114/76 | HR 51 | Ht 63.0 in | Wt 237.0 lb

## 2024-08-05 DIAGNOSIS — I10 Essential (primary) hypertension: Secondary | ICD-10-CM | POA: Diagnosis not present

## 2024-08-05 DIAGNOSIS — I480 Paroxysmal atrial fibrillation: Secondary | ICD-10-CM | POA: Diagnosis not present

## 2024-08-05 DIAGNOSIS — D6869 Other thrombophilia: Secondary | ICD-10-CM

## 2024-08-05 NOTE — Patient Instructions (Signed)
 Medication Instructions:    Your physician recommends that you continue on your current medications as directed. Please refer to the Current Medication list given to you today.  *If you need a refill on your cardiac medications before your next appointment, please call your pharmacy*  Lab Work:  NONE ORDERED  TODAY   If you have labs (blood work) drawn today and your tests are completely normal, you will receive your results only by: MyChart Message (if you have MyChart) OR A paper copy in the mail If you have any lab test that is abnormal or we need to change your treatment, we will call you to review the results.  Testing/Procedures: NONE ORDERED  TODAY    Follow-Up: At [facility], you and your health needs are our priority.  As part of our continuing mission to provide you with exceptional heart care, our providers are all part of one team.  This team includes your primary Cardiologist (physician) and Advanced Practice Providers or APPs (Physician Assistants and Nurse Practitioners) who all work together to provide you with the care you need, when you need it.  Your next appointment:  IN APRIL  Provider:  Stanly DELENA Leavens, MD    We recommend signing up for the patient portal called MyChart.  Sign up information is provided on this After Visit Summary.  MyChart is used to connect with patients for Virtual Visits (Telemedicine).  Patients are able to view lab/test results, encounter notes, upcoming appointments, etc.  Non-urgent messages can be sent to your provider as well.   To learn more about what you can do with MyChart, go to [email or web address].   Other Instructions

## 2024-09-20 ENCOUNTER — Other Ambulatory Visit: Payer: Self-pay | Admitting: Internal Medicine

## 2024-09-22 NOTE — Telephone Encounter (Signed)
 Prescription refill request for Eliquis  received. Indication: A-Fib Last office visit: 01/04/24 Scr: 0.92 04/14/24 Care Everywhere Age: 63 Weight: 107.5 KG Pt has passed all Parameters. Dose is Good

## 2025-01-07 ENCOUNTER — Ambulatory Visit: Admitting: Internal Medicine
